# Patient Record
Sex: Male | Born: 1975 | Race: White | Hispanic: No | Marital: Married | Smoking: Never smoker
Health system: Southern US, Community
[De-identification: ages and names within clinical notes are randomized; demographics above are authoritative.]

## PROBLEM LIST (undated history)

## (undated) DIAGNOSIS — K219 Gastro-esophageal reflux disease without esophagitis: Secondary | ICD-10-CM

## (undated) DIAGNOSIS — Q2112 Patent foramen ovale: Secondary | ICD-10-CM

## (undated) DIAGNOSIS — L409 Psoriasis, unspecified: Secondary | ICD-10-CM

## (undated) DIAGNOSIS — K589 Irritable bowel syndrome without diarrhea: Secondary | ICD-10-CM

## (undated) DIAGNOSIS — Q211 Atrial septal defect: Secondary | ICD-10-CM

## (undated) DIAGNOSIS — I10 Essential (primary) hypertension: Secondary | ICD-10-CM

## (undated) DIAGNOSIS — E785 Hyperlipidemia, unspecified: Secondary | ICD-10-CM

## (undated) HISTORY — DX: Patent foramen ovale: Q21.12

## (undated) HISTORY — DX: Psoriasis, unspecified: L40.9

## (undated) HISTORY — DX: Hyperlipidemia, unspecified: E78.5

## (undated) HISTORY — DX: Essential (primary) hypertension: I10

## (undated) HISTORY — DX: Atrial septal defect: Q21.1

## (undated) HISTORY — DX: Gastro-esophageal reflux disease without esophagitis: K21.9

## (undated) HISTORY — DX: Irritable bowel syndrome, unspecified: K58.9

---

## 2000-10-03 ENCOUNTER — Encounter: Admission: RE | Admit: 2000-10-03 | Discharge: 2000-10-03 | Payer: Self-pay | Admitting: Family Medicine

## 2000-10-03 ENCOUNTER — Encounter: Payer: Self-pay | Admitting: Emergency Medicine

## 2002-09-06 ENCOUNTER — Encounter: Admission: RE | Admit: 2002-09-06 | Discharge: 2002-09-06 | Payer: Self-pay | Admitting: Emergency Medicine

## 2002-09-06 ENCOUNTER — Encounter: Payer: Self-pay | Admitting: Emergency Medicine

## 2002-12-13 ENCOUNTER — Emergency Department (HOSPITAL_COMMUNITY): Admission: EM | Admit: 2002-12-13 | Discharge: 2002-12-13 | Payer: Self-pay | Admitting: Emergency Medicine

## 2006-02-25 ENCOUNTER — Observation Stay (HOSPITAL_COMMUNITY): Admission: EM | Admit: 2006-02-25 | Discharge: 2006-02-26 | Payer: Self-pay | Admitting: Emergency Medicine

## 2006-04-10 ENCOUNTER — Encounter (INDEPENDENT_AMBULATORY_CARE_PROVIDER_SITE_OTHER): Payer: Self-pay | Admitting: *Deleted

## 2006-04-10 ENCOUNTER — Ambulatory Visit (HOSPITAL_COMMUNITY): Admission: RE | Admit: 2006-04-10 | Discharge: 2006-04-10 | Payer: Self-pay | Admitting: *Deleted

## 2007-04-16 ENCOUNTER — Emergency Department (HOSPITAL_COMMUNITY): Admission: EM | Admit: 2007-04-16 | Discharge: 2007-04-16 | Payer: Self-pay | Admitting: Emergency Medicine

## 2008-10-07 HISTORY — PX: SHOULDER SURGERY: SHX246

## 2010-09-12 ENCOUNTER — Ambulatory Visit (HOSPITAL_COMMUNITY)
Admission: RE | Admit: 2010-09-12 | Discharge: 2010-09-12 | Payer: Self-pay | Source: Home / Self Care | Attending: Orthopedic Surgery | Admitting: Orthopedic Surgery

## 2010-12-17 LAB — DIFFERENTIAL
Basophils Absolute: 0 K/uL (ref 0.0–0.1)
Basophils Relative: 1 % (ref 0–1)
Eosinophils Absolute: 0.1 K/uL (ref 0.0–0.7)
Eosinophils Relative: 2 % (ref 0–5)
Lymphocytes Relative: 35 % (ref 12–46)
Lymphs Abs: 1.5 K/uL (ref 0.7–4.0)
Monocytes Absolute: 0.4 K/uL (ref 0.1–1.0)
Monocytes Relative: 11 % (ref 3–12)
Neutro Abs: 2.1 K/uL (ref 1.7–7.7)
Neutrophils Relative %: 51 % (ref 43–77)

## 2010-12-17 LAB — CBC
Hemoglobin: 15.9 g/dL (ref 13.0–17.0)
MCV: 93.9 fL (ref 78.0–100.0)
Platelets: 219 10*3/uL (ref 150–400)
RBC: 4.78 MIL/uL (ref 4.22–5.81)
RDW: 11.7 % (ref 11.5–15.5)
WBC: 4.2 10*3/uL (ref 4.0–10.5)

## 2010-12-17 LAB — COMPREHENSIVE METABOLIC PANEL WITH GFR
ALT: 37 U/L (ref 0–53)
AST: 22 U/L (ref 0–37)
Albumin: 4.2 g/dL (ref 3.5–5.2)
Alkaline Phosphatase: 60 U/L (ref 39–117)
BUN: 9 mg/dL (ref 6–23)
CO2: 26 meq/L (ref 19–32)
Calcium: 9.3 mg/dL (ref 8.4–10.5)
Chloride: 107 meq/L (ref 96–112)
Creatinine, Ser: 0.74 mg/dL (ref 0.4–1.5)
GFR calc non Af Amer: >60 mL/min
Glucose, Bld: 83 mg/dL (ref 70–99)
Potassium: 4.7 meq/L (ref 3.5–5.1)
Sodium: 139 meq/L (ref 135–145)
Total Bilirubin: 1.1 mg/dL (ref 0.3–1.2)
Total Protein: 6.6 g/dL (ref 6.0–8.3)

## 2010-12-17 LAB — SURGICAL PCR SCREEN
MRSA, PCR: NEGATIVE
Staphylococcus aureus: NEGATIVE

## 2011-02-22 NOTE — Discharge Summary (Signed)
Alan Lawson, BOARDLEY NO.:  [id number]   MEDICAL RECORD NO.:  [id number]          PATIENT TYPE:  INP   LOCATION:  3015                         FACILITY:  [facility]   PHYSICIAN:  Genene Churn. Love, M.D.    DATE OF BIRTH:  11-Feb-1976   DATE OF ADMISSION:  02/25/2006  DATE OF DISCHARGE:  02/26/2006                                 DISCHARGE SUMMARY   This was the first [facility] admission for this 35 year old right-  handed white married male from [place], [place], admitted for  evaluation of blurred vision, right arm pain and numbness, speech  disturbance, and headache that was recurring.   HISTORY OF PRESENT ILLNESS:  Mr. Leclaire has no known history of risk factors  for stroke such as high blood pressure, diabetes, heart disease, or  cigarette use.  His sister has a history of migraine headaches, and the  patient has probably had four headaches that were migraine in his life.  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  It was  associated with numbness and then headache, mostly in the back of his head.  His wife noted that he had difficulty getting his words out.  A CT scan and  MRI study of the brain were normal at [facility].  He had a recurrent episode in the emergency room and was admitted for MRA  and observation.  His past medical history was remarkable for concussion  while in the 7th grade.  He had no real exposure to toxins. He drank 5 or 6  beers per week.  He did not use cigarettes.  His examination was normal.  He  did have a heart rate of 53.   LABORATORY DATA:  White blood cell count was 7500, hemoglobin 14.4,  hematocrit 42, platelet count 300,000.  PT was 14.4, INR was 1.1.  His  sodium is 137.  The potassium was 3.6.  His chloride was 102, bicarbonate  was 28, BUN was 11, and his creatinine was 0.8.  His calcium was 9.2.   His  liver function tests were normal.  Telemetry in the hospital showed sinus  bradycardia, and at one time his heart rate got down into the 39 range while  asleep.  His 12-lead EKG showed sinus bradycardia but otherwise was normal.  Intracranial MRA without contrast was normal.  There was some irregularity  of the P1 segment on the right.  MR angiogram of the neck was normal.  Doppler study of the carotids was normal, and vertebral flow was antegrade.  EEG was normal during the awake and sleep states.  A 2D echocardiogram which  he had been performed by Dr. Reyes Ivan as part of a pre-employment for SW[employer]  team evaluation on 11/26/2005 was normal.  A Doppler study with transcranial  evaluation was to be performed and would be set up as an outpatient.   HOSPITAL COURSE:  The patient had no recurrent episodes in the  hospital, was  maintained on aspirin, and he was felt most likely to have had complex  migraine.  His pending studies at the time of discharge include an ANA and  sed rate and an outpatient Doppler with bubble study.   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   PLAN:  Aspirin. outpatient studies as above.  He is discharged improved from  his prehospital status.           ______________________________  Genene Churn. Sandria Manly, M.D.     JML/MEDQ  D:  02/27/2006  T:  02/27/2006  Job:  [id number]   cc:   Reuben Likes, M.D.  Fax: [phone]

## 2011-02-22 NOTE — H&P (Signed)
NAMEESMERALDA, Lawson NO.:  [id number]   MEDICAL RECORD NO.:  [id number]          PATIENT TYPE:  INP   LOCATION:  1823                         FACILITY:  [facility]   PHYSICIAN:  Genene Churn. Love, M.D.    DATE OF BIRTH:  Dec 12, 1975   DATE OF ADMISSION:  02/25/2006  DATE OF DISCHARGE:                                HISTORY & PHYSICAL   This first [facility] admission for this 35 year old right-handed  white married male from [place], [place] admitted from the  emergency room for evaluation of blurred vision, headache, aphasia and right-  sided numbness.   HISTORY OF PRESENT ILLNESS:  Alan Lawson has been in good health his entire  life.  He has a positive family history of migraines and his sisters had  severe migraine headaches.  Mr. Alan Lawson has also had four severe headaches in  his life each preceded by visual disturbance lasting approximately 5 minutes  which he thought most likely represented migraine.  He was in his usual  state of health until the day of admission when 12:30 noon he noted right  eye blurry vision.  Subsequently, he had right arm pain and numbness in the  right side and posterior headache followed by nausea and vomiting; this  recurred.  His wife noticed that he was confused and he had difficulty in  getting his words out, though he knew what he wanted to say.  He had a CT  scan and MRI study of the brain at [facility] which were normal.  He then had recurrence of symptoms following the tests.  He has no history  of drug or alcohol abuse.  He had no recent history of head or neck trauma.  He does not have any history of car sickness as a child.   His past medical history is significant for concussion in seventh grade with  loss of consciousness and amnesia lasting hours.  He is on no medications.  He drinks 5/6 beers per week.  He does not have any allergies.   FAMILY HISTORY:  His mother is 16 and his father is 65 living and  well.  He  has a sister 69 living and well.   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   EXAMINATION:  Well-developed white male.  Neck was not stiff.  Heart rate  53, blood pressure right arm 10/58, left arm 120/60, temperature was  afebrile.  Mental status shows he is alert, oriented x3.  Follows one, two  and three-step commands.  Cranial nerve examination revealed right ptosis.  Visual fields full.  Disks were flat.  The extraocular movements were full  and corneals were present.  There was no seventh nerve palsy.  Tongue was  midline.  The uvula was midline and gags were present.  Sternocleidomastoid  and trapezius testing were normal.  Motor examination revealed 5/5 strength  in the upper and lower extremities.  Coordination testing revealed finger-to-  nose, heel-to-shin, rapid alternating movements to be intact.  Sensory  examination was intact.  Deep tendon reflexes were 2+.  Plantar responses  were downgoing.  General examination was unremarkable.   LABORATORY DATA:  CT scan and MRI study of the brain were normal but no  intracranial MRA was performed. White blood cell count 7500, hemoglobin  14.4, hematocrit 42.0, platelets 300,000.  PT was 11.4, INR was 1.1, PTT was  28.  Sodium is 137, potassium 3.6, chloride 102, CO2 content 28, BUN 11,  creatinine 0.8, glucose 98, calcium 9.2.  Liver function tests were normal.   IMPRESSION:  1.  Headaches, code 784.0.  2.  Blurred vision, code 368.8.  3.  Aphasia, code 784.3.  4.  Right-sided numbness, code 782.0.   Plan at this time is to admit the patient for an extracranial MRA and  extracranial MRA, sed rate and ANA.           ______________________________  Genene Churn. Sandria Manly, M.D.     JML/MEDQ  D:  02/25/2006  T:  02/25/2006  Job:  [id number]   cc:   Reuben Likes, M.D.  Fax: [phone]

## 2011-02-22 NOTE — Procedures (Signed)
EEG NUMBER:  [id number]   HISTORY:  This is a 35 year old patient who is admitted for evaluation of  visual disturbance involving the left brain. The patient is being evaluated  for this event. This is a routine EEG. No skull defects are noted.   EEG CLASSIFICATION:  Normal, awake and asleep.   DESCRIPTION OF RECORDING:  The background rhythm of this recording consists  of fairly well modulated amplitude alpha rhythm 10 Hz and is reactive to eye  opening and closure. As the record progresses, the patient initially is in  the wake state but enters a drowsy state with the dropout of the background  rhythm activities and onset of theta frequency slowing. The patient does  seem to enter early stage 2 sleep with rudimentary sleep spindles and k  complexes complexes seen. Towards the end of the recording, the patient is  re-alerted, photic stimulation is performed resulting in bilateral and  symmetric photic driving response. Hyperventilation is also performed  resulting in a minimal buildup of background rhythm activity without  significant slowing seen. At no time during the recording does there appear  to be evidence of spike or spike wave discharges or evidence of focal  slowing. EKG monitor shows no evidence of cardiac rhythm abnormalities with  a heart rate of 56.   IMPRESSION:  This is a normal EEG recording in a waking and sleeping state.  No evidence of ictal or interictal discharges were seen.      Marlan Palau, M.D.  Electronically Signed     ZOX:WRUE  D:  02/26/2006 18:40:11  T:  02/27/2006 09:14:16  Job #:  [id number]

## 2011-12-02 ENCOUNTER — Emergency Department (INDEPENDENT_AMBULATORY_CARE_PROVIDER_SITE_OTHER): Payer: 59

## 2011-12-02 ENCOUNTER — Other Ambulatory Visit: Payer: Self-pay

## 2011-12-02 ENCOUNTER — Emergency Department (INDEPENDENT_AMBULATORY_CARE_PROVIDER_SITE_OTHER)
Admission: EM | Admit: 2011-12-02 | Discharge: 2011-12-02 | Disposition: A | Discharge status: Home / Self Care | Payer: 59 | Source: Home / Self Care | Attending: Emergency Medicine | Admitting: Emergency Medicine

## 2011-12-02 ENCOUNTER — Encounter (HOSPITAL_COMMUNITY): Payer: Self-pay | Admitting: *Deleted

## 2011-12-02 DIAGNOSIS — B349 Viral infection, unspecified: Secondary | ICD-10-CM

## 2011-12-02 DIAGNOSIS — B9789 Other viral agents as the cause of diseases classified elsewhere: Secondary | ICD-10-CM

## 2011-12-02 LAB — DIFFERENTIAL
Basophils Relative: 1 % (ref 0–1)
Eosinophils Absolute: 0.1 10*3/uL (ref 0.0–0.7)
Eosinophils Relative: 2 % (ref 0–5)
Lymphs Abs: 1.3 10*3/uL (ref 0.7–4.0)
Monocytes Absolute: 0.7 10*3/uL (ref 0.1–1.0)
Monocytes Relative: 17 % — ABNORMAL HIGH (ref 3–12)
Neutrophils Relative %: 48 % (ref 43–77)

## 2011-12-02 LAB — COMPREHENSIVE METABOLIC PANEL
ALT: 29 U/L (ref 0–53)
Alkaline Phosphatase: 82 U/L (ref 39–117)
BUN: 19 mg/dL (ref 6–23)
CO2: 29 mEq/L (ref 19–32)
Chloride: 102 mEq/L (ref 96–112)
Creatinine, Ser: 0.7 mg/dL (ref 0.50–1.35)
GFR calc Af Amer: >90 mL/min (ref >90)
Potassium: 4.2 mEq/L (ref 3.5–5.1)
Total Bilirubin: 0.3 mg/dL (ref 0.3–1.2)

## 2011-12-02 LAB — CBC
HCT: 44.5 % (ref 39.0–52.0)
Hemoglobin: 15.7 g/dL (ref 13.0–17.0)
MCH: 33.3 pg (ref 26.0–34.0)
MCHC: 35.3 g/dL (ref 30.0–36.0)
RDW: 11.7 % (ref 11.5–15.5)

## 2011-12-02 LAB — LIPASE, BLOOD: Lipase: 31 U/L (ref 11–59)

## 2011-12-02 MED ORDER — MELOXICAM 15 MG PO TABS: 15.0000 mg | ORAL_TABLET | Freq: Every day | ORAL | Status: AC

## 2011-12-02 MED ORDER — SUCRALFATE 1 GM/10ML PO SUSP: 1.0000 g | Freq: Four times a day (QID) | ORAL | Status: DC

## 2011-12-02 NOTE — ED Provider Notes (Signed)
Chief Complaint  Patient presents with  . 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 6 days ago he developed some chest pain described as a soreness in the left pectoral area. This felt like a muscle soreness. The next day the soreness extended to his upper back across the shoulder blade area. Thereafter he developed a sensation of his stomach knotting up. He didn't have much of an appetite which is unusual for him and his stools were white colored. Yesterday he tried to run, and he only ran about 5 minutes then developed generalized, nausea, vomiting, and his chest was hurting again. He denies any fever, chills, sweats, nasal congestion, rhinorrhea, or sore throat. He's had no coughing, wheezing, or shortness of breath. No palpitations or dizziness. He denies any diarrhea or urinary symptoms.  Review of Systems:  Other than noted above, the patient denies any of the following symptoms. Systemic:  No fever, chills, sweats, or fatigue. ENT:  No nasal congestion, rhinorrhea, or sore throat. Pulmonary:  No cough, wheezing, shortness of breath, sputum production, hemoptysis. Cardiac:  No palpitations, rapid heartbeat, dizziness, presyncope or syncope. GI:  No abdominal pain, heartburn, nausea, or vomiting. Skin:  No rash or itching. Ext:  No leg pain or swelling.   PMFSH:  Past medical history, family history, social history, meds, and allergies were reviewed and updated as needed.  Physical Exam:   Vital signs:  BP 123/75  Pulse 50  Temp(Src) 97.3 F (36.3 C) (Oral)  Resp 16  SpO2 100% Gen:  Alert, oriented, in no distress, skin warm and dry. Eye:  PERRL, lids and conjunctivas normal.  Sclera non-icteric. ENT:  Mucous membranes moist, pharynx clear. Neck:  Supple, no adenopathy or tenderness.  No JVD. Lungs:  Clear to auscultation, no wheezes, rales or rhonchi.  No respiratory distress. Heart:  Regular rhythm.   No gallops, murmers, clicks or rubs. Chest:  No chest wall tenderness. Abdomen:  Soft, nontender, no organomegaly or mass.  Bowel sounds normal.  No pulsatile abdominal mass or bruit. Ext:  No edema.  No calf tenderness and Homann's sign negative.  Pulses full and equal. Skin:  Warm and dry.  No rash.  Labs:   Results for orders placed during the hospital encounter of 12/02/11  CBC      Component Value Range   WBC 4.1  4.0 - 10.5 (K/uL)   RBC 4.71  4.22 - 5.81 (MIL/uL)   Hemoglobin 15.7  13.0 - 17.0 (g/dL)   HCT 16.1  09.6 - 04.5 (%)   MCV 94.5  78.0 - 100.0 (fL)   MCH 33.3  26.0 - 34.0 (pg)   MCHC 35.3  30.0 - 36.0 (g/dL)   RDW 40.9  81.1 - 91.4 (%)   Platelets 216  150 - 400 (K/uL)  DIFFERENTIAL      Component Value Range   Neutrophils Relative 48  43 - 77 (%)   Neutro Abs 2.0  1.7 - 7.7 (K/uL)   Lymphocytes Relative 33  12 - 46 (%)   Lymphs Abs 1.3  0.7 - 4.0 (K/uL)   Monocytes Relative 17 (*) 3 - 12 (%)   Monocytes Absolute 0.7  0.1 - 1.0 (K/uL)   Eosinophils Relative 2  0 - 5 (%)   Eosinophils Absolute 0.1  0.0 - 0.7 (K/uL)   Basophils Relative 1  0 - 1 (%)   Basophils Absolute  0.0  0.0 - 0.1 (K/uL)  LIPASE, BLOOD      Component Value Range   Lipase 31  11 - 59 (U/L)  COMPREHENSIVE METABOLIC PANEL      Component Value Range   Sodium 140  135 - 145 (mEq/L)   Potassium 4.2  3.5 - 5.1 (mEq/L)   Chloride 102  96 - 112 (mEq/L)   CO2 29  19 - 32 (mEq/L)   Glucose, Bld 84  70 - 99 (mg/dL)   BUN 19  6 - 23 (mg/dL)   Creatinine, Ser 1.61  0.50 - 1.35 (mg/dL)   Calcium 9.6  8.4 - 09.6 (mg/dL)   Total Protein 7.6  6.0 - 8.3 (g/dL)   Albumin 4.1  3.5 - 5.2 (g/dL)   AST 23  0 - 37 (U/L)   ALT 29  0 - 53 (U/L)   Alkaline Phosphatase 82  39 - 117 (U/L)   Total Bilirubin 0.3  0.3 - 1.2 (mg/dL)   GFR calc non Af Amer >90  >90 (mL/min)   GFR calc Af Amer >90  >90 (mL/min)     Radiology:  Dg Chest 2 View  12/02/2011  *RADIOLOGY REPORT*  Clinical Data: Chest, abdominal pain   CHEST - 2 VIEW  Comparison: 09/12/2010  Findings: Lungs are clear. No pleural effusion or pneumothorax.  Cardiomediastinal silhouette is within normal limits.  Visualized osseous structures are within normal limits.  IMPRESSION: Normal chest radiographs.  Original Report Authenticated By: Charline Bills, M.D.    EKG:   Date: 12/02/2011  Rate: 42  Rhythm: sinus bradycardia  QRS Axis: normal  Intervals: normal  ST/T Wave abnormalities: normal  Conduction Disutrbances:none  Narrative Interpretation: Moderate sinus bradycardia, otherwise normal EKG.  Old EKG Reviewed: none available  Assessment:   Diagnoses that have been ruled out:  None  Diagnoses that are still under consideration:  None  Final diagnoses:  Viral syndrome    Plan:   1.  The following meds were prescribed:   New Prescriptions   MELOXICAM (MOBIC) 15 MG TABLET    Take 1 tablet (15 mg total) by mouth daily.   SUCRALFATE (CARAFATE) 1 GM/10ML SUSPENSION    Take 10 mLs (1 g total) by mouth 4 (four) times daily.   2.  The patient was instructed in symptomatic care and handouts were given. 3.  The patient was told to return if becoming worse in any way, if no better in 3 or 4 days, and given some red flag symptoms that would indicate earlier return.  Follow up:  The patient was told to follow up in 48 hours if no improvement.     Roque Lias, MD 12/02/11 [phone]

## 2011-12-02 NOTE — Discharge Instructions (Signed)
Rest and get extra fluids.  If no better in 3 days, return here or to your primary care doctor.

## 2011-12-02 NOTE — ED Notes (Signed)
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

## 2012-03-29 ENCOUNTER — Ambulatory Visit (INDEPENDENT_AMBULATORY_CARE_PROVIDER_SITE_OTHER): Payer: 59 | Admitting: Family Medicine

## 2012-03-29 VITALS — BP 130/73 | HR 65 | Temp 98.3°F | Resp 16 | Ht 75.0 in | Wt 175.0 lb

## 2012-03-29 DIAGNOSIS — H66009 Acute suppurative otitis media without spontaneous rupture of ear drum, unspecified ear: Secondary | ICD-10-CM

## 2012-03-29 DIAGNOSIS — H6690 Otitis media, unspecified, unspecified ear: Secondary | ICD-10-CM

## 2012-03-29 DIAGNOSIS — H60339 Swimmer's ear, unspecified ear: Secondary | ICD-10-CM

## 2012-03-29 MED ORDER — AMOXICILLIN 500 MG PO CAPS: 1000.0000 mg | ORAL_CAPSULE | Freq: Three times a day (TID) | ORAL | Status: AC

## 2012-03-29 MED ORDER — NEOMYCIN-POLYMYXIN-HC 3.5-10000-1 OT SOLN: 3.0000 [drp] | Freq: Four times a day (QID) | OTIC | Status: AC

## 2012-03-29 NOTE — Patient Instructions (Signed)
Otitis Media, Adult  A middle ear infection is an infection in the space behind the eardrum. The medical name for this is "otitis media." It may happen after a common cold. It is caused by a germ that starts growing in that space. You may feel swollen glands in your neck on the side of the ear infection.  HOME CARE INSTRUCTIONS    Take your medicine as directed until it is gone, even if you feel better after the first few days.   Only take over-the-counter or prescription medicines for pain, discomfort, or fever as directed by your caregiver.   Occasional use of a nasal decongestant a couple times per day may help with discomfort and help the eustachian tube to drain better.  Follow up with your caregiver in 10 to 14 days or as directed, to be certain that the infection has cleared. Not keeping the appointment could result in a chronic or permanent injury, pain, hearing loss and disability. If there is any problem keeping the appointment, you must call back to this facility for assistance.  SEEK IMMEDIATE MEDICAL CARE IF:    You are not getting better in 2 to 3 days.   You have pain that is not controlled with medication.   You feel worse instead of better.   You cannot use the medication as directed.   You develop swelling, redness or pain around the ear or stiffness in your neck.  MAKE SURE YOU:    Understand these instructions.   Will watch your condition.   Will get help right away if you are not doing well or get worse.  Document Released: 06/28/2004 Document Revised: 09/12/2011 Document Reviewed: 04/29/2008  Exit[employer] Patient Information 2012 [employer]  Otitis Externa  Otitis externa ("swimmer's ear") is a germ (bacterial) or fungal infection of the outer ear canal (from the eardrum to the outside of the ear). Swimming in dirty water may cause swimmer's ear. It also may be caused by moisture in the ear from water remaining after swimming or bathing. Often the first signs of infection may be  itching in the ear canal. This may progress to ear canal swelling, redness, and pus drainage, which may be signs of infection.  HOME CARE INSTRUCTIONS    Apply the antibiotic drops to the ear canal as prescribed by your doctor.   This can be a very painful medical condition. A strong pain reliever may be prescribed.   Only take over-the-counter or prescription medicines for pain, discomfort, or fever as directed by your caregiver.   If your caregiver has given you a follow-up appointment, it is very important to keep that appointment. Not keeping the appointment could result in a chronic or permanent injury, pain, hearing loss and disability. If there is any problem keeping the appointment, you must call back to this facility for assistance.  PREVENTION    It is important to keep your ear dry. Use the corner of a towel to wick water out of the ear canal after swimming or bathing.   Avoid scratching in your ear. This can damage the ear canal or remove the protective wax lining the canal and make it easier for germs (bacteria) or a fungus to grow.   You may use ear drops made of rubbing alcohol and vinegar after swimming to prevent future "swimmer's ear" infections. Make up a small bottle of equal parts white vinegar and alcohol. Put 3 or 4 drops into each ear after swimming.   Avoid swimming   in lakes, polluted water, or poorly chlorinated pools.  SEEK MEDICAL CARE IF:    An oral temperature above 102 F (38.9 C) develops.   Your ear is still painful after 3 days and shows signs of getting worse (redness, swelling, pain, or pus).  MAKE SURE YOU:    Understand these instructions.   Will watch your condition.   Will get help right away if you are not doing well or get worse.  Document Released: 09/23/2005 Document Revised: 09/12/2011 Document Reviewed: 04/29/2008  [employer] Patient Information 2012 [employer]

## 2012-03-29 NOTE — Progress Notes (Signed)
  Subjective:    Patient ID: Alan Lawson, male    DOB: Feb 04, 1976, 36 y.o.   MRN: [id number]  HPI EAR PAIN Location:  L ear  Description: L ear fullness and pain  Onset:  2-3 days  Modifying factors: baseline hx/o psoriasis. Well controlled. Has been swimming daily.    Symptoms  Sensation of fullness: mild Ear discharge: no URI symptoms: no  Fever: no Tinnitus:no   Dizziness:no   Hearing loss:no   Toothache: no' mild L sided jaw pain  Rashes or lesions: no Facial muscle weakness: no  Red Flags Recent trauma: no PMH prior ear surgery:  no Diabetes or Immunosuppresion: psoriasis, well controlled   Review of Systems See HPI, otherwise ROS negative     Objective:   Physical Exam Gen: up in chair, NAD HEENT: NCAT, EOMI, L TM bulging and erythema, + tenderness to otoscopic evaluation on L  CV: RRR, no murmurs auscultated PULM: CTAB, no wheezes, rales, rhoncii ABD: S/NT/+ bowel sounds  EXT: 2+ peripheral pulses    Assessment & Plan:  Concominant otitis media and otitis externa.  Will treat with amox and cortisporin.  Discussed general pool precautions as well as infectious red flags.  Handout given.     The patient and/or caregiver has been counseled thoroughly with regard to treatment plan and/or medications prescribed including dosage, schedule, interactions, rationale for use, and possible side effects and they verbalize understanding. Diagnoses and expected course of recovery discussed and will return if not improved as expected or if the condition worsens. Patient and/or caregiver verbalized understanding.

## 2015-01-03 ENCOUNTER — Ambulatory Visit (INDEPENDENT_AMBULATORY_CARE_PROVIDER_SITE_OTHER): Payer: 59 | Admitting: Urgent Care

## 2015-01-03 VITALS — BP 120/72 | HR 58 | Temp 97.9°F | Resp 16 | Ht 76.5 in | Wt 180.8 lb

## 2015-01-03 DIAGNOSIS — A084 Viral intestinal infection, unspecified: Secondary | ICD-10-CM

## 2015-01-03 DIAGNOSIS — R197 Diarrhea, unspecified: Secondary | ICD-10-CM | POA: Diagnosis not present

## 2015-01-03 DIAGNOSIS — R1013 Epigastric pain: Secondary | ICD-10-CM

## 2015-01-03 DIAGNOSIS — R112 Nausea with vomiting, unspecified: Secondary | ICD-10-CM

## 2015-01-03 LAB — POCT CBC
GRANULOCYTE PERCENT: 62.8 % (ref 37–80)
HEMATOCRIT: 47.6 % (ref 43.5–53.7)
Hemoglobin: 15.2 g/dL (ref 14.1–18.1)
LYMPH, POC: 1.6 (ref 0.6–3.4)
MCH: 30.7 pg (ref 27–31.2)
MCHC: 31.9 g/dL (ref 31.8–35.4)
MCV: 96.2 fL (ref 80–97)
MID (cbc): 0.5 (ref 0–0.9)
MPV: 7.3 fL (ref 0–99.8)
PLATELET COUNT, POC: 275 10*3/uL (ref 142–424)
POC Granulocyte: 3.5 (ref 2–6.9)
POC LYMPH PERCENT: 27.9 %L (ref 10–50)
POC MID %: 9.3 % (ref 0–12)
RBC: 4.95 M/uL (ref 4.69–6.13)
RDW, POC: 12.9 %
WBC: 5.6 10*3/uL (ref 4.6–10.2)

## 2015-01-03 NOTE — Progress Notes (Signed)
  Medical screening examination/treatment/procedure(s) were performed by non-physician practitioner and as supervising physician I was immediately available for consultation/collaboration.     

## 2015-01-03 NOTE — Patient Instructions (Signed)

## 2015-01-03 NOTE — Progress Notes (Signed)
MRN: [id number] DOB: Sep 29, 1976  Subjective:   Alan Lawson is a 39 y.o. male presenting for chief complaint of diarhea; Emesis; Chest Pain; and Fatigue  Reports 4 day history of watery malodorous diarrhea, severe projectile vomiting (Friday only) even elicited back pain, states he lost 6 lbs since Friday, has had chills, abdominal and epigastric pain. Also, reports that he has been feeling mid-sternum to mid left-sided chest pain; pain is dull, constant with intermittent sharper pains, non-radiating. He has tried ibuprofen intermittently with some relief. He admits history of PFO, states that it was 2mm, stable at his last visit with Dr. Valentino Saxon, has not needed follow up in a "long time". Of note, on Friday, patient went to [employer] in the afternoon, had 1 hot dog in the evening, started having symptoms Friday evening. Also states he ate a pack of pistachios on Thursday, then became concerned that he saw on the news a report of salmonella contamination with the same bag of pistachios that he ate. Denies fevers, cough, shob, heart racing, palpitations, diaphoresis, jaw pain, neck pain, radiation of chest pain to back. He has not spent time camping, drink water from unclean sources. Denies smoking, 5-6 beers per week. Denies any other aggravating or relieving factors, no other questions or concerns.  Markcus has a current medication list which includes the following prescription(s): calcium carbonate-vitamin d, glucosamine hcl, ibuprofen, and triamcinolone cream. He has No Known Allergies.  Dailyn  has no past medical history on file. Also  has no past surgical history on file.  ROS As in subjective.  Objective:   Vitals: BP 120/72 mmHg  Pulse 58  Temp(Src) 97.9 F (36.6 C) (Oral)  Resp 16  Ht 6' 4.5" (1.943 m)  Wt 180 lb 12.8 oz (82.01 kg)  BMI 21.72 kg/m2  SpO2 98%  Physical Exam  Constitutional: He is oriented to person, place, and time and well-developed, well-nourished,  and in no distress.  HENT:  Postnasal drainage present in cobblestone appearance.  Eyes: Conjunctivae and EOM are normal. Right eye exhibits no discharge. Left eye exhibits no discharge. No scleral icterus.  Cardiovascular: Normal rate, regular rhythm and intact distal pulses.  Exam reveals no gallop and no friction rub.   No murmur heard. No re-producible chest pain on palpation.  Pulmonary/Chest: No respiratory distress. He has no wheezes. He has no rales. He exhibits no tenderness.  Abdominal: Soft. He exhibits no distension and no mass. There is tenderness (epigastric). There is no rebound and no guarding.  Slightly hyperactive bowel sounds.  Neurological: He is alert and oriented to person, place, and time.  Skin: Skin is warm and dry. No rash noted. No erythema.   Results for orders placed or performed in visit on 01/03/15 (from the past 24 hour(s))  POCT CBC     Status: None   Collection Time: 01/03/15 12:22 PM  Result Value Ref Range   WBC 5.6 4.6 - 10.2 K/uL   Lymph, poc 1.6 0.6 - 3.4   POC LYMPH PERCENT 27.9 10 - 50 %L   MID (cbc) 0.5 0 - 0.9   POC MID % 9.3 0 - 12 %M   POC Granulocyte 3.5 2 - 6.9   Granulocyte percent 62.8 37 - 80 %G   RBC 4.95 4.69 - 6.13 M/uL   Hemoglobin 15.2 14.1 - 18.1 g/dL   HCT, POC 78.2 95.6 - 53.7 %   MCV 96.2 80 - 97 fL   MCH, POC 30.7 27 -  31.2 pg   MCHC 31.9 31.8 - 35.4 g/dL   RDW, POC 16.112.9 %   Platelet Count, POC 275 142 - 424 K/uL   MPV 7.3 0 - 99.8 fL   Assessment and Plan :   1. Non-intractable vomiting with nausea, vomiting of unspecified type 2. Watery diarrhea 3. Abdominal pain, epigastric 4. Viral gastroenteritis - Reassured patient, advised aggressive hydration, light meals including soups, reintroduce solids in 2-3 days. Patient declined ECG, GI cocktail. Anticipatory guidance provided. Recheck in 5 days if symptoms persist or sooner if they worsen.   Wallis BambergMario Kharma Sampsel, PA-C [facility] [phone] 01/03/2015 11:50 AM

## 2015-01-04 ENCOUNTER — Telehealth: Payer: Self-pay | Admitting: Radiology

## 2015-01-04 NOTE — Telephone Encounter (Signed)
Pt was seen 01/03/14 and wife Lanora Manislizabeth states he is feeling dramatically worse. She is afraid he has food poisoning b/c he ingested pistachios that were recalled. She is requesting a cb to discuss. [phone].

## 2015-01-04 NOTE — Telephone Encounter (Signed)
Wife called concerned that patient is still having watery diarrhea, had ~15 BM yesterday, this slowed down today but she is worried that he has salmonella given the news report and confirming that the patient had the exposure to the foods in question, namely pistachios. Denies bloody diarrhea, fevers, abdominal pain. Admits that the patient has a history of IBS. Has seen a GI doctor more than 10 years ago, has not needed follow up since then. I advised patient's wife that he can try Imodium to help with diarrhea. Re-emphasized aggressive hydration, BRAT diet until symptoms resolve, thereafter slowly reintroduce his normal foods. If the patient continues to have symptoms, he can return to clinic for stool culture in the next 2 days. Patient's wife agreed.  Alan BambergMario Glory Graefe, PA-C [facility] [phone] 01/04/2015  11:26 AM

## 2015-09-28 ENCOUNTER — Ambulatory Visit (INDEPENDENT_AMBULATORY_CARE_PROVIDER_SITE_OTHER): Payer: Commercial Managed Care - HMO | Admitting: Cardiovascular Disease

## 2015-09-28 ENCOUNTER — Encounter: Payer: Self-pay | Admitting: Cardiovascular Disease

## 2015-09-28 VITALS — BP 138/72 | HR 68 | Ht 77.0 in | Wt 180.0 lb

## 2015-09-28 DIAGNOSIS — E785 Hyperlipidemia, unspecified: Secondary | ICD-10-CM | POA: Diagnosis not present

## 2015-09-28 DIAGNOSIS — Q211 Atrial septal defect: Secondary | ICD-10-CM | POA: Diagnosis not present

## 2015-09-28 DIAGNOSIS — Q2112 Patent foramen ovale: Secondary | ICD-10-CM

## 2015-09-28 NOTE — Assessment & Plan Note (Signed)
Alan Lawson is a 39 year old tall thin and fit-appearing Caucasian male reasonably soft surface father Alan Lawson is also patient.Marland Kitchen. He is  self-referred because of a prior 2-D echo that showed a PFO. He is otherwise a symptomatic and runs super marathons. He denies chest pain, shortness of breath, syncope. Since his many years since his PFO is diagnosed and there is a question of a valvular abnormality as well as well as a question of Marfan's disease, we'll get a 2-D echo to further evaluate his aortic root and valve

## 2015-09-28 NOTE — Progress Notes (Addendum)
     09/28/2015 Unknown Alan Lawson   10/12/1975  [id number]  Primary Physician No primary care provider on file. Primary Cardiologist: Runell GessJonathan J. 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 He was self-referred because his father, Alan Lawson, is also patient might. He has no risk factors other than family history. He suffered a hemiplegic migraine back in 2005 and had a 2-D echo with bubble study that showed a small PFO. He is incredibly fit and runs "super marathons". He is totally asymptomatic.   Current Outpatient Prescriptions  Medication Sig Dispense Refill  . Calcium Carbonate-Vitamin D (CALCIUM + D PO) Take by mouth.    Marland Kitchen. GLUCOSAMINE HCL PO Take by mouth.    Marland Kitchen. ibuprofen (ADVIL,MOTRIN) 200 MG tablet Take 200 mg by mouth every 6 (six) hours as needed.    . triamcinolone cream (KENALOG) 0.1 % Apply topically 2 (two) times daily.     No current facility-administered medications for this visit.    No Known Allergies  Social History   Social History  . Marital Status: Married    Spouse Name: N/A  . Number of Children: N/A  . Years of Education: N/A   Occupational History  . Not on file.   Social History Main Topics  . Smoking status: Never Smoker   . Smokeless tobacco: Not on file  . Alcohol Use: Yes     Comment: occasional  . Drug Use: Not on file  . Sexual Activity: Not on file   Other Topics Concern  . Not on file   Social History Narrative     Review of Systems: General: negative for chills, fever, night sweats or weight changes.  Cardiovascular: negative for chest pain, dyspnea on exertion, edema, orthopnea, palpitations, paroxysmal nocturnal dyspnea or shortness of breath Dermatological: negative for rash Respiratory: negative for cough or wheezing Urologic: negative for hematuria Abdominal: negative for nausea, vomiting, diarrhea, bright red blood  per rectum, melena, or hematemesis Neurologic: negative for visual changes, syncope, or dizziness All other systems reviewed and are otherwise negative except as noted above.    Blood pressure 138/72, pulse 68, height 6\' 5"  (1.956 m), weight 180 lb (81.647 kg).  General appearance: alert and no distress Neck: no adenopathy, no carotid bruit, no JVD, supple, symmetrical, trachea midline and thyroid not enlarged, symmetric, no tenderness/mass/nodules Lungs: clear to auscultation bilaterally Heart: regular rate and rhythm, S1, S2 normal, no murmur, click, rub or gallop Extremities: extremities normal, atraumatic, no cyanosis or edema  EKG normal sinus rhythm at 68 without ST or T-wave changes. I personally reviewed this EKG  ASSESSMENT AND PLAN:   PFO (patent foramen ovale) Alan Lawson is a 39 year old tall thin and fit-appearing Caucasian male reasonably soft surface father Alan Lawson is also patient.Marland Kitchen. He is  self-referred because of a prior 2-D echo that showed a PFO. He is otherwise a symptomatic and runs super marathons. He denies chest pain, shortness of breath, syncope. Since his many years since his PFO is diagnosed and there is a question of a valvular abnormality as well as well as a question of Marfan's disease, we'll get a 2-D echo to further evaluate his aortic root and valve      Runell GessJonathan J. Lorah Kalina MD [facility]FACP,FACC,FAHA, [facility]FSCAI 09/28/2015 2:12 PM

## 2015-09-28 NOTE — Patient Instructions (Signed)
Medication Instructions:  Your physician recommends that you continue on your current medications as directed. Please refer to the Current Medication list given to you today.   Labwork: Your physician recommends that you return for lab work in: FASTING (lipid/liver) The lab can be found on the FIRST FLOOR of out building in [address]   Testing/Procedures: Your physician has requested that you have an echocardiogram. Echocardiography is a painless test that uses sound waves to create images of your heart. It provides your doctor with information about the size and shape of your heart and how well your heart's chambers and valves are working. This procedure takes approximately one hour. There are no restrictions for this procedure.   Follow-Up: Follow up with Dr. Allyson SabalBerry as needed, pending results of Echocardiogram.   Any Other Special Instructions Will Be Listed Below (If Applicable).     If you need a refill on your cardiac medications before your next appointment, please call your pharmacy.

## 2015-10-03 ENCOUNTER — Other Ambulatory Visit: Payer: Self-pay

## 2015-10-03 ENCOUNTER — Ambulatory Visit (HOSPITAL_COMMUNITY): Payer: Commercial Managed Care - HMO | Attending: Cardiovascular Disease

## 2015-10-03 DIAGNOSIS — Q2112 Patent foramen ovale: Secondary | ICD-10-CM

## 2015-10-03 DIAGNOSIS — Q211 Atrial septal defect: Secondary | ICD-10-CM | POA: Insufficient documentation

## 2015-10-04 LAB — HEPATIC FUNCTION PANEL
ALBUMIN: 4.4 g/dL (ref 3.6–5.1)
ALK PHOS: 63 U/L (ref 40–115)
ALT: 37 U/L (ref 9–46)
AST: 24 U/L (ref 10–40)
Bilirubin, Direct: 0.1 mg/dL (ref <=0.2)
Indirect Bilirubin: 0.7 mg/dL (ref 0.2–1.2)
TOTAL PROTEIN: 7.4 g/dL (ref 6.1–8.1)
Total Bilirubin: 0.8 mg/dL (ref 0.2–1.2)

## 2015-10-04 LAB — LIPID PANEL
CHOLESTEROL: 228 mg/dL — AB (ref 125–200)
HDL: 64 mg/dL (ref >=40)
LDL CALC: 136 mg/dL — AB (ref <130)
TRIGLYCERIDES: 142 mg/dL (ref <150)
Total CHOL/HDL Ratio: 3.6 Ratio (ref <=5.0)
VLDL: 28 mg/dL (ref <30)

## 2015-10-04 NOTE — Progress Notes (Signed)
   Patient ID: Alan Lawson, male    DOB: 02/20/1976, 39 y.o.   MRN: [id number]  HPI    Review of Systems    Physical Exam

## 2015-10-05 ENCOUNTER — Telehealth: Payer: Self-pay | Admitting: Cardiovascular Disease

## 2015-10-05 DIAGNOSIS — E785 Hyperlipidemia, unspecified: Secondary | ICD-10-CM

## 2015-10-05 NOTE — Telephone Encounter (Signed)
Returning your call. °

## 2015-10-05 NOTE — Telephone Encounter (Signed)
Pt aware of Echo and Lipid/liver results

## 2015-11-06 ENCOUNTER — Telehealth: Payer: Self-pay

## 2015-11-06 NOTE — Telephone Encounter (Addendum)
Pt states his family all have ringworm and he would like to know if the Dr would call him something in just in case he get one. Please call [phone]     [facility] [address]

## 2015-11-06 NOTE — Telephone Encounter (Signed)
Advise that an OTC antifungal would work very well should he start to have itchy rash.  Deliah Boston, MS, PA-C 12:17 PM, 11/06/2015

## 2015-11-07 ENCOUNTER — Ambulatory Visit (INDEPENDENT_AMBULATORY_CARE_PROVIDER_SITE_OTHER): Payer: Commercial Managed Care - HMO | Admitting: Family Medicine

## 2015-11-07 VITALS — BP 124/78 | HR 56 | Temp 98.4°F | Resp 17 | Ht 76.5 in | Wt 195.0 lb

## 2015-11-07 DIAGNOSIS — Z8619 Personal history of other infectious and parasitic diseases: Secondary | ICD-10-CM | POA: Diagnosis not present

## 2015-11-07 DIAGNOSIS — B80 Enterobiasis: Secondary | ICD-10-CM

## 2015-11-07 MED ORDER — IVERMECTIN 3 MG PO TABS: ORAL_TABLET | ORAL | Status: DC

## 2015-11-07 NOTE — Progress Notes (Signed)
Patient ID: Alan Lawson, male    DOB: 1976/07/24  Age: 40 y.o. MRN: [id number]  Chief Complaint  Patient presents with  . patient states he has a pin worm    Subjective:   40 year old man who has a history of his family having pinworms. His daughter probably got it in daycare, and has currently. His wife another child also been treated. He has some itching of his anus, and is in here for treatment.  Current allergies, medications, problem list, past/family and social histories reviewed.  Objective:  BP 124/78 mmHg  Pulse 56  Temp(Src) 98.4 F (36.9 C) (Oral)  Resp 17  Ht 6' 4.5" (1.943 m)  Wt 195 lb (88.451 kg)  BMI 23.43 kg/m2  SpO2 97%  Exam not done.  Assessment & Plan:   Assessment: 1. Pinworm infection   2. History of intestinal parasite       Plan: Pinworms in the family. Will treat. Several treatment options are available. He has insurance.  Meds ordered this encounter  Medications  . ivermectin (STROMECTOL) 3 MG TABS tablet    Sig: Take 6 tablets single dose of mouth. Repeat in 2 weeks.    Dispense:  12 tablet    Refill:  0         Patient Instructions  Take ivermectin 6 tablets (18 mg) single dose today. Repeat in 2 weeks.       Return if symptoms worsen or fail to improve.   Angeles Zehner, MD 11/07/2015

## 2015-11-07 NOTE — Patient Instructions (Signed)
Take ivermectin 6 tablets (18 mg) single dose today. Repeat in 2 weeks.

## 2015-11-08 ENCOUNTER — Encounter: Payer: Self-pay | Admitting: Cardiovascular Disease

## 2015-11-08 ENCOUNTER — Ambulatory Visit (INDEPENDENT_AMBULATORY_CARE_PROVIDER_SITE_OTHER): Payer: Commercial Managed Care - HMO | Admitting: Cardiovascular Disease

## 2015-11-08 VITALS — BP 118/80 | HR 64 | Ht 76.5 in | Wt 203.6 lb

## 2015-11-08 DIAGNOSIS — Q211 Atrial septal defect: Secondary | ICD-10-CM | POA: Diagnosis not present

## 2015-11-08 DIAGNOSIS — E785 Hyperlipidemia, unspecified: Secondary | ICD-10-CM | POA: Insufficient documentation

## 2015-11-08 DIAGNOSIS — Q2112 Patent foramen ovale: Secondary | ICD-10-CM

## 2015-11-08 NOTE — Progress Notes (Signed)
Alan Lawson returns today for follow-up of his 2-D echo and lab work. There is no evidence of PFO on transthoracic echo although agitated saline was not administered. LV function was normal. The descending aorta showed no evidence of dilatation. He was concerned about Marfan's syndrome. His lipid profile revealed total cholesterol 228 with LDL 136 and HDL 64. This was performed 2 days after Christmas during holiday meals. Ordinarily, his diet is fairly healthy. Will recheck a lipid and liver profile in March and I will see him back as needed.

## 2015-11-08 NOTE — Patient Instructions (Signed)
Medication Instructions:  Your physician recommends that you continue on your current medications as directed. Please refer to the Current Medication list given to you today.   Labwork: REMINDER: March to lab to get you Lipids/liver checked  Testing/Procedures: none  Follow-Up: Follow up with Dr. Allyson Sabal as needed.   Any Other Special Instructions Will Be Listed Below (If Applicable).     If you need a refill on your cardiac medications before your next appointment, please call your pharmacy.

## 2015-11-08 NOTE — Assessment & Plan Note (Signed)
History of PFO demonstrated by transesophageal echo remotely in the setting of a migraine. Recent 2-D echo performed on 10/03/15 did not show intra-atrial shunt. His EF was 50-55%. His descending aorta showed no dilatation. He was worried about Marfan's disease.

## 2015-11-08 NOTE — Assessment & Plan Note (Signed)
Recent lab work showed total cholesterol 220 LDL 136 and HDL of 64. This was done after Christmas during the time of dietary indiscretion. Would ordinarily, his diet is fairly "clean". We will recheck a lipid liver profile in March.

## 2015-11-26 ENCOUNTER — Ambulatory Visit: Payer: Worker's Compensation

## 2015-11-26 ENCOUNTER — Ambulatory Visit (INDEPENDENT_AMBULATORY_CARE_PROVIDER_SITE_OTHER): Payer: Worker's Compensation | Admitting: Family Medicine

## 2015-11-26 VITALS — BP 108/64 | HR 70 | Temp 97.8°F | Resp 12 | Ht 77.0 in | Wt 190.6 lb

## 2015-11-26 DIAGNOSIS — R0789 Other chest pain: Secondary | ICD-10-CM | POA: Diagnosis not present

## 2015-11-26 DIAGNOSIS — S20212A Contusion of left front wall of thorax, initial encounter: Secondary | ICD-10-CM

## 2015-11-26 MED ORDER — HYDROCODONE-ACETAMINOPHEN 5-325 MG PO TABS: 1.0000 | ORAL_TABLET | Freq: Four times a day (QID) | ORAL | Status: DC | PRN

## 2015-11-26 NOTE — Progress Notes (Signed)
Subjective:    Patient ID: Alan Lawson, male    DOB: [birth year], 40 y.o.   MRN: [id number]  HPI Alan Lawson is a 40 y.o. male Here for an injury that occurred at work. 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  Date of injury: Thursday 11/22/16.   During patrol training techniques, engaged with other officer, got off balance, other officer landed on patient and felt pop on upper left back/side. Now feels pain radiate around past 3 days. Initially on left side of back only. No rash or bruising. No dyspnea.   Tx: Ibuprofen, up to  every 6 hours. No other treatment. Ibuprofen helps some but still feels breakthrough.  Hx of broken ribs x 3 in past - location unknown.   No Known Allergies  Prior to Admission medications   Medication Sig Start Date End Date Taking? Authorizing Provider  Calcium Carbonate-Vitamin D (CALCIUM + D PO) Take by mouth.   Yes Historical Provider, MD  GLUCOSAMINE HCL PO Take by mouth.   Yes Historical Provider, MD  ibuprofen (ADVIL,MOTRIN) 200 MG tablet Take 200 mg by mouth every 6 (six) hours as needed for moderate pain.    Yes Historical Provider, MD  triamcinolone cream (KENALOG) 0.1 % Apply topically 2 (two) times daily.   Yes Historical Provider, MD  ivermectin (STROMECTOL) 3 MG TABS tablet Take 6 tablets single dose of mouth. Repeat in 2 weeks. Patient not taking: Reported on 11/26/2015 11/07/15   Peyton Najjar, MD    Review of Systems  Constitutional: Negative for fever and chills.  Respiratory: Negative for shortness of breath.        Chest wall pain  Musculoskeletal: Positive for myalgias and arthralgias.  Skin: Negative for rash.       Objective:   Physical Exam  Constitutional: He is oriented to person, place, and time. He appears well-developed and well-nourished.  HENT:  Head: Normocephalic and atraumatic.  Eyes: EOM are normal. Pupils are equal, round, and reactive to light.  Neck:  No JVD present. Carotid bruit is not present.  Cardiovascular: Normal rate, regular rhythm and normal heart sounds.   No murmur heard. Pulmonary/Chest: Effort normal and breath sounds normal. No accessory muscle usage. No respiratory distress. He has no decreased breath sounds. He has no rales.      Musculoskeletal: He exhibits no edema.  Neurological: He is alert and oriented to person, place, and time.  Skin: Skin is warm and dry.  Psychiatric: He has a normal mood and affect.  Vitals reviewed.  Filed Vitals:   11/26/15 1350  BP: 108/64  Pulse: 70  Temp: 97.8 F (36.6 C)  TempSrc: Oral  Resp: 12  Height:  (1.956 m)  Weight: 190 lb 9.6 oz (86.456 kg)  SpO2: 97%   Dg Ribs Unilateral W/chest Left  11/26/2015  CLINICAL DATA:  Posterior left chest pain following on the job injury 3 days ago. History of rib fractures. EXAM: LEFT RIBS AND CHEST - 3+ VIEW COMPARISON:  None. FINDINGS: Heart size and mediastinal contours are normal. The lungs are clear. There is no pleural effusion or pneumothorax. There is slight irregularity of the left seventh rib laterally on the oblique view, possibly related to old injury. No displaced acute fractures are seen. IMPRESSION: Slight irregularity of the left seventh rib. No evidence of acute displaced rib fracture, pleural effusion or pneumothorax. Electronically Signed   By: Alan Noa  Purcell Mouton M.D.   On: 11/26/2015 16:35       Assessment & Plan:   Alan Lawson is a 40 y.o. male Left-sided chest wall pain - Plan: DG Ribs Unilateral W/Chest Left, HYDROcodone-acetaminophen (NORCO/VICODIN) 5-325 MG tablet  Rib contusion, left, initial encounter - Plan: HYDROcodone-acetaminophen (NORCO/VICODIN) 5-325 MG tablet Injury to left chest wall due to injury at work 4 days ago. No apparent rib fracture on x-ray. Suspect a contusion with secondary intercostal muscle strain/discomfort.  -Continue symptomatic care with ibuprofen 600 mg every 6 hours when necessary  with food.  -Incentive spirometry discussed with frequent deep breaths to lessen chance of atelectasis.  -Hydrocodone prescription given if breakthrough pain, side effects discussed.  -Work restrictions for next 5 days, then reassess. Sooner if worse.  Meds ordered this encounter  Medications  . HYDROcodone-acetaminophen (NORCO/VICODIN) 5-325 MG tablet    Sig: Take 1 tablet by mouth every 6 (six) hours as needed for moderate pain.    Dispense:  15 tablet    Refill:  0   Patient Instructions   I did not see a displaced rib fracture on your x-rays today. The radiologist will also read this shortly. See information below on rib contusion. Okay to continue ibuprofen 600 mg every 6 hours with food, hydrocodone if needed for breakthrough pain, but do not operate machinery while taking hydrocodone. Follow-up in the next 5 days for recheck. In the meantime avoid overhead lifting or heavy lifting with the left side of the body. See paperwork for your employer.  Rib Contusion A rib contusion is a deep bruise on your rib area. Contusions are the result of a blunt trauma that causes bleeding and injury to the tissues under the skin. A rib contusion may involve bruising of the ribs and of the skin and muscles in the area. The skin overlying the contusion may turn blue, purple, or yellow. Minor injuries will give you a painless contusion, but more severe contusions may stay painful and swollen for a few weeks. CAUSES  A contusion is usually caused by a blow, trauma, or direct force to an area of the body. This often occurs while playing contact sports. SYMPTOMS  Swelling and redness of the injured area.  Discoloration of the injured area.  Tenderness and soreness of the injured area.  Pain with or without movement. DIAGNOSIS  The diagnosis can be made by taking a medical history and performing a physical exam. An X-ray, CT scan, or MRI may be needed to determine if there were any associated injuries,  such as broken bones (fractures) or internal injuries. TREATMENT  Often, the best treatment for a rib contusion is rest. Icing or applying cold compresses to the injured area may help reduce swelling and inflammation. Deep breathing exercises may be recommended to reduce the risk of partial lung collapse and pneumonia. Over-the-counter or prescription medicines may also be recommended for pain control. HOME CARE INSTRUCTIONS   Apply ice to the injured area:  Put ice in a plastic bag.  Place a towel between your skin and the bag.  Leave the ice on for 20 minutes, 2-3 times per day.  Take medicines only as directed by your health care provider.  Rest the injured area. Avoid strenuous activity and any activities or movements that cause pain. Be careful during activities and avoid bumping the injured area.  Perform deep-breathing exercises as directed by your health care provider.  Do not lift anything that is heavier than 5 lb (2.3 kg)  until your health care provider approves.  Do not use any tobacco products, including cigarettes, chewing tobacco, or electronic cigarettes. If you need help quitting, ask your health care provider. SEEK MEDICAL CARE IF:   You have increased bruising or swelling.  You have pain that is not controlled with treatment.  You have a fever. SEEK IMMEDIATE MEDICAL CARE IF:   You have difficulty breathing or shortness of breath.  You develop a continual cough, or you cough up thick or bloody sputum.  You feel sick to your stomach (nauseous), you throw up (vomit), or you have abdominal pain.   This information is not intended to replace advice given to you by your health care provider. Make sure you discuss any questions you have with your health care provider.   Document Released: 06/18/2001 Document Revised: 10/14/2014 Document Reviewed: 07/05/2014 Elsevier Interactive Patient Education [employer]  Because you received an x-ray today, you will  receive an invoice from [facility]. Please contact [facility] at [phone] with questions or concerns regarding your invoice. Our billing staff will not be able to assist you with those questions.

## 2015-11-26 NOTE — Patient Instructions (Addendum)
I did not see a displaced rib fracture on your x-rays today. The radiologist will also read this shortly. See information below on rib contusion. Okay to continue ibuprofen 600 mg every 6 hours with food, hydrocodone if needed for breakthrough pain, but do not operate machinery while taking hydrocodone. Follow-up in the next 5 days for recheck. In the meantime avoid overhead lifting or heavy lifting with the left side of the body. See paperwork for your employer.  Rib Contusion A rib contusion is a deep bruise on your rib area. Contusions are the result of a blunt trauma that causes bleeding and injury to the tissues under the skin. A rib contusion may involve bruising of the ribs and of the skin and muscles in the area. The skin overlying the contusion may turn blue, purple, or yellow. Minor injuries will give you a painless contusion, but more severe contusions may stay painful and swollen for a few weeks. CAUSES  A contusion is usually caused by a blow, trauma, or direct force to an area of the body. This often occurs while playing contact sports. SYMPTOMS  Swelling and redness of the injured area.  Discoloration of the injured area.  Tenderness and soreness of the injured area.  Pain with or without movement. DIAGNOSIS  The diagnosis can be made by taking a medical history and performing a physical exam. An X-ray, CT scan, or MRI may be needed to determine if there were any associated injuries, such as broken bones (fractures) or internal injuries. TREATMENT  Often, the best treatment for a rib contusion is rest. Icing or applying cold compresses to the injured area may help reduce swelling and inflammation. Deep breathing exercises may be recommended to reduce the risk of partial lung collapse and pneumonia. Over-the-counter or prescription medicines may also be recommended for pain control. HOME CARE INSTRUCTIONS   Apply ice to the injured area:  Put ice in a plastic bag.  Place a  towel between your skin and the bag.  Leave the ice on for 20 minutes, 2-3 times per day.  Take medicines only as directed by your health care provider.  Rest the injured area. Avoid strenuous activity and any activities or movements that cause pain. Be careful during activities and avoid bumping the injured area.  Perform deep-breathing exercises as directed by your health care provider.  Do not lift anything that is heavier than 5 lb (2.3 kg) until your health care provider approves.  Do not use any tobacco products, including cigarettes, chewing tobacco, or electronic cigarettes. If you need help quitting, ask your health care provider. SEEK MEDICAL CARE IF:   You have increased bruising or swelling.  You have pain that is not controlled with treatment.  You have a fever. SEEK IMMEDIATE MEDICAL CARE IF:   You have difficulty breathing or shortness of breath.  You develop a continual cough, or you cough up thick or bloody sputum.  You feel sick to your stomach (nauseous), you throw up (vomit), or you have abdominal pain.   This information is not intended to replace advice given to you by your health care provider. Make sure you discuss any questions you have with your health care provider.   Document Released: 06/18/2001 Document Revised: 10/14/2014 Document Reviewed: 07/05/2014 Elsevier Interactive Patient Education [employer]  Because you received an x-ray today, you will receive an invoice from [facility]. Please contact [facility] at [phone] with questions or concerns regarding your invoice. Our billing staff will  not be able to assist you with those questions.

## 2017-07-18 ENCOUNTER — Encounter: Payer: Self-pay | Admitting: Physician Assistant

## 2017-07-18 ENCOUNTER — Ambulatory Visit (INDEPENDENT_AMBULATORY_CARE_PROVIDER_SITE_OTHER): Payer: 59 | Admitting: Physician Assistant

## 2017-07-18 ENCOUNTER — Ambulatory Visit (INDEPENDENT_AMBULATORY_CARE_PROVIDER_SITE_OTHER): Payer: 59

## 2017-07-18 VITALS — BP 126/82 | HR 70 | Temp 97.7°F | Resp 16 | Ht 77.0 in | Wt 175.8 lb

## 2017-07-18 DIAGNOSIS — M25522 Pain in left elbow: Secondary | ICD-10-CM

## 2017-07-18 DIAGNOSIS — L03114 Cellulitis of left upper limb: Secondary | ICD-10-CM | POA: Diagnosis not present

## 2017-07-18 DIAGNOSIS — M7989 Other specified soft tissue disorders: Secondary | ICD-10-CM | POA: Diagnosis not present

## 2017-07-18 DIAGNOSIS — M25422 Effusion, left elbow: Secondary | ICD-10-CM

## 2017-07-18 LAB — POCT CBC
Granulocyte percent: 85 %G — AB (ref 37–80)
HCT, POC: 44.8 % (ref 43.5–53.7)
HEMOGLOBIN: 15.1 g/dL (ref 14.1–18.1)
Lymph, poc: 1.1 (ref 0.6–3.4)
MCH, POC: 33.1 pg — AB (ref 27–31.2)
MCHC: 33.6 g/dL (ref 31.8–35.4)
MCV: 98.4 fL — AB (ref 80–97)
MID (cbc): 0.6 (ref 0–0.9)
MPV: 7.9 fL (ref 0–99.8)
PLATELET COUNT, POC: 200 10*3/uL (ref 142–424)
POC GRANULOCYTE: 10 — AB (ref 2–6.9)
POC LYMPH PERCENT: 9.5 %L — AB (ref 10–50)
POC MID %: 5.5 %M (ref 0–12)
RBC: 4.55 M/uL — AB (ref 4.69–6.13)
RDW, POC: 12.9 %
WBC: 11.8 10*3/uL — AB (ref 4.6–10.2)

## 2017-07-18 MED ORDER — DOXYCYCLINE HYCLATE 100 MG PO CAPS: 100.0000 mg | ORAL_CAPSULE | Freq: Two times a day (BID) | ORAL | 0 refills | Status: DC

## 2017-07-18 MED ORDER — CEFTRIAXONE SODIUM 1 G IJ SOLR
1.0000 g | Freq: Once | INTRAMUSCULAR | Status: AC
  Administered 2017-07-18: 1 g via INTRAMUSCULAR

## 2017-07-18 NOTE — Progress Notes (Signed)
BROOKE PAYES  MRN: [id number] DOB: 1976-10-03  Subjective:  Alan Lawson is a 41 y.o. male seen in office today for a chief complaint of left elbow pain x 1 day. Has associated erythema and swelling. Has tried ice with no full relief. Denies acute injury, purulent drainage, loss of ROM, numbness, and tingling. He notes he did scrape this elbow against a wall while at work a week ago. Denies hx of MRSA, diabetes, or gout. Denies smoking.   Review of Systems  Constitutional: Negative for chills, diaphoresis and fever.  Gastrointestinal: Negative for nausea and vomiting.  Neurological: Negative for dizziness and light-headedness.    Patient Active Problem List   Diagnosis Date Noted  . Hyperlipidemia 11/08/2015  . PFO (patent foramen ovale) 09/28/2015    Current Outpatient Prescriptions on File Prior to Visit  Medication Sig Dispense Refill  . ibuprofen (ADVIL,MOTRIN) 200 MG tablet Take 200 mg by mouth every 6 (six) hours as needed for moderate pain.     . Calcium Carbonate-Vitamin D (CALCIUM + D PO) Take by mouth.    Marland Kitchen GLUCOSAMINE HCL PO Take by mouth.    Marland Kitchen HYDROcodone-acetaminophen (NORCO/VICODIN) 5-325 MG tablet Take 1 tablet by mouth every 6 (six) hours as needed for moderate pain. (Patient not taking: Reported on 07/18/2017) 15 tablet 0  . triamcinolone cream (KENALOG) 0.1 % Apply topically 2 (two) times daily.     No current facility-administered medications on file prior to visit.     No Known Allergies    Social History   Social History  . Marital status: Married    Spouse name: N/A  . Number of children: N/A  . Years of education: N/A   Occupational History  . Not on file.   Social History Main Topics  . Smoking status: Never Smoker  . Smokeless tobacco: Never Used  . Alcohol use 0.0 oz/week     Comment: occasional  . Drug use: No  . Sexual activity: Not on file   Other Topics Concern  . Not on file   Social History Narrative  . No narrative on file     Objective:  BP 126/82   Pulse 70   Temp 97.7 F (36.5 C) (Oral)   Resp 16   Ht  (1.956 m)   Wt 175 lb 12.8 oz (79.7 kg)   SpO2 98%   BMI 20.85 kg/m   Physical Exam  Constitutional: He is oriented to person, place, and time and well-developed, well-nourished, and in no distress.  HENT:  Head: Normocephalic and atraumatic.  Eyes: Conjunctivae are normal.  Neck: Normal range of motion.  Cardiovascular:  Pulses:      Radial pulses are 2+ on the right side, and 2+ on the left side.  Pulmonary/Chest: Effort normal.  Musculoskeletal:     Neurological: He is alert and oriented to person, place, and time. Gait normal.  ROM and sensation intact of left arm. Strength is 5/5 in left arm.   Skin: Skin is warm and dry.     Psychiatric: Affect normal.  Vitals reviewed.    Results for orders placed or performed in visit on 07/18/17 (from the past 24 hour(s))  POCT CBC     Status: Abnormal   Collection Time: 07/18/17  2:34 PM  Result Value Ref Range   WBC 11.8 (A) 4.6 - 10.2 K/uL   Lymph, poc 1.1 0.6 - 3.4   POC LYMPH PERCENT 9.5 (A) 10 - 50 %L  MID (cbc) 0.6 0 - 0.9   POC MID % 5.5 0 - 12 %M   POC Granulocyte 10.0 (A) 2 - 6.9   Granulocyte percent 85.0 (A) 37 - 80 %G   RBC 4.55 (A) 4.69 - 6.13 M/uL   Hemoglobin 15.1 14.1 - 18.1 g/dL   HCT, POC 16.1 09.6 - 53.7 %   MCV 98.4 (A) 80 - 97 fL   MCH, POC 33.1 (A) 27 - 31.2 pg   MCHC 33.6 31.8 - 35.4 g/dL   RDW, POC 04.5 %   Platelet Count, POC 200 142 - 424 K/uL   MPV 7.9 0 - 99.8 fL   Dg Elbow Complete Left (3+view)  Result Date: 07/18/2017 CLINICAL DATA:  Swelling, redness and warmth for 1 day. EXAM: LEFT ELBOW - COMPLETE 3+ VIEW COMPARISON:  04/16/2007 FINDINGS: The joint spaces are maintained. No acute bony findings or osteochondral abnormality. There is a tiny olecranon spur. No joint effusion. Soft tissue swelling noted over the olecranon region. Findings could suggest olecranon bursitis. IMPRESSION: No acute  bony findings or joint effusion. Swelling over the olecranon region could suggest olecranon bursitis. Electronically Signed   By: Rudie Meyer M.D.   On: 07/18/2017 14:43     Assessment and Plan :  1. Pain and swelling of left elbow - POCT CBC - DG ELBOW COMPLETE LEFT (3+VIEW); Future 2. Cellulitis of left upper extremity PE findings consistent with cellulitis. Plain films suggest possible olecranon bursitis. I cannot appreciate any fluctuance on exam. WBC elevated at 11.8. Will cover with both IM ceftriaxone and oral doxycycline at this time. I have outline the erythema with a surgical marker and encouraged pt to return in 3 days to ensure appropriate healing. Given strict return/ED precautions if symptoms worsen or he develops new fever, chills, loss of ROM, nausea, and vomiting.  - cefTRIAXone (ROCEPHIN) injection 1 g; Inject 1 g into the muscle once. - doxycycline (VIBRAMYCIN) 100 MG capsule; Take 1 capsule (100 mg total) by mouth 2 (two) times daily.  Dispense: 20 capsule; Refill: 0  Benjiman Core PA-C  [facility] at [facility] 07/18/2017 2:46 PM

## 2017-07-18 NOTE — Addendum Note (Signed)
Addended by: Benjiman Core D on: 07/18/2017 03:36 PM   Modules accepted: Orders

## 2017-07-18 NOTE — Patient Instructions (Addendum)
Physical exam findings are consistent with cellulitis and potentially underlying bursitis. We are going to treat with antibiotics. You received a shot of rocephin today and will be placed on oral doxycycline for 10 days. Please return on Monday for reevaluation to ensure appropriately healing. In the meantime, if you develop worsening pain, swelling, warmth, redness, or new fever, chills, nausea, or vomiting, please seek care immediately. Thank you for letting me participate in your health and well being.  While taking Doxycycline:  -Do not drink milk or take iron supplements, multivitamins, calcium supplements, antacids, laxatives within 2 hours before or after taking doxycycline. -Avoid direct exposure to sunlight or tanning beds. Doxycycline can make you sunburn more easily. Wear protective clothing and use sunscreen (SPF 30 or higher) when you are outdoors. -Antibiotic medicines can cause diarrhea, which may be a sign of a new infection. If you have diarrhea that is watery or bloody, stop taking this medicine and seek medical care.     Cellulitis, Adult Cellulitis is a skin infection. The infected area is usually red and sore. This condition occurs most often in the arms and lower legs. It is very important to get treated for this condition. Follow these instructions at home:  Take over-the-counter and prescription medicines only as told by your doctor.  If you were prescribed an antibiotic medicine, take it as told by your doctor. Do not stop taking the antibiotic even if you start to feel better.  Drink enough fluid to keep your pee (urine) clear or pale yellow.  Do not touch or rub the infected area.  Raise (elevate) the infected area above the level of your heart while you are sitting or lying down.  Place warm or cold wet cloths (warm or cold compresses) on the infected area. Do this as told by your doctor.  Keep all follow-up visits as told by your doctor. This is important. These  visits let your doctor make sure your infection is not getting worse. Contact a doctor if:  You have a fever.  Your symptoms do not get better after 1-2 days of treatment.  Your bone or joint under the infected area starts to hurt after the skin has healed.  Your infection comes back. This can happen in the same area or another area.  You have a swollen bump in the infected area.  You have new symptoms.  You feel ill and also have muscle aches and pains. Get help right away if:  Your symptoms get worse.  You feel very sleepy.  You throw up (vomit) or have watery poop (diarrhea) for a long time.  There are red streaks coming from the infected area.  Your red area gets larger.  Your red area turns darker. This information is not intended to replace advice given to you by your health care provider. Make sure you discuss any questions you have with your health care provider. Document Released: 03/11/2008 Document Revised: 02/29/2016 Document Reviewed: 08/02/2015 Elsevier Interactive Patient Education  2018 [employer] Inc[employer]    Elbow Bursitis A bursa is a fluid-filled sac that covers and protects a joint. Bursitis is when the fluid-filled sac gets puffy and sore (inflamed). Elbow bursitis, also called olecranon bursitis, happens over your elbow. This may be caused by:  Injury (acute trauma) to your elbow.  Leaning on hard surfaces for long periods of time.  Infection from an injury that breaks the skin near your elbow.  A bone growth (spur) that forms at the tip of your  elbow.  A medical condition that causes inflammation in your body, such as: ? Gout. ? Rheumatoid arthritis.  Sometimes the cause is not known. Follow these instructions at home:  Take medicines only as told by your doctor.  If you were prescribed an antibiotic medicine, finish all of it even if you start to feel better.  If your bursitis is caused by an injury, rest your elbow and wear your bandage as  told by your doctor. You may also apply ice to the injured area as told by your doctor: ? Put ice in a plastic bag. ? Place a towel between your skin and the bag. ? Leave the ice on for 20 minutes, 2-3 times per day.  Do not do any activities that cause pain to your elbow.  Use elbow pads or wraps to cushion your elbow. Contact a doctor if:  You have a fever.  Your symptoms do not get better with treatment.  Your pain or swelling gets worse.  Your pain or swelling goes away and then comes back.  You have drainage of pus from the swollen area over your elbow. This information is not intended to replace advice given to you by your health care provider. Make sure you discuss any questions you have with your health care provider. Document Released: 03/13/2010 Document Revised: 02/29/2016 Document Reviewed: 06/01/2014 Elsevier Interactive Patient Education  2018 [employer].   IF you received an x-ray today, you will receive an invoice from [facility]. Please contact [facility] at [phone] with questions or concerns regarding your invoice.   IF you received labwork today, you will receive an invoice from [place]. Please contact [facility] at [phone] with questions or concerns regarding your invoice.   Our billing staff will not be able to assist you with questions regarding bills from these companies.  You will be contacted with the lab results as soon as they are available. The fastest way to get your results is to activate your My Chart account. Instructions are located on the last page of this paperwork. If you have not heard from [place] regarding the results in 2 weeks, please contact this office.

## 2017-07-21 ENCOUNTER — Ambulatory Visit (INDEPENDENT_AMBULATORY_CARE_PROVIDER_SITE_OTHER): Payer: 59 | Admitting: Physician Assistant

## 2017-07-21 ENCOUNTER — Ambulatory Visit: Payer: Self-pay | Admitting: Physician Assistant

## 2017-07-21 ENCOUNTER — Encounter: Payer: Self-pay | Admitting: Physician Assistant

## 2017-07-21 VITALS — BP 120/72 | HR 58 | Temp 98.3°F | Resp 16 | Ht 74.0 in | Wt 180.0 lb

## 2017-07-21 DIAGNOSIS — L03114 Cellulitis of left upper limb: Secondary | ICD-10-CM

## 2017-07-21 LAB — POCT CBC
Granulocyte percent: 67.7 % (ref 37–80)
HCT, POC: 41.9 % — AB (ref 43.5–53.7)
Hemoglobin: 14.3 g/dL (ref 14.1–18.1)
Lymph, poc: 1.8 (ref 0.6–3.4)
MCH, POC: 33.4 pg — AB (ref 27–31.2)
MCHC: 34.2 g/dL (ref 31.8–35.4)
MCV: 97.5 fL — AB (ref 80–97)
MID (cbc): 0.6 (ref 0–0.9)
MPV: 7.4 fL (ref 0–99.8)
POC Granulocyte: 4.9 (ref 2–6.9)
POC LYMPH PERCENT: 24.6 %L (ref 10–50)
POC MID %: 7.7 % (ref 0–12)
Platelet Count, POC: 253 10*3/uL (ref 142–424)
RBC: 4.29 M/uL — AB (ref 4.69–6.13)
RDW, POC: 12.7 %
WBC: 7.3 10*3/uL (ref 4.6–10.2)

## 2017-07-21 NOTE — Patient Instructions (Signed)
     IF you received an x-ray today, you will receive an invoice from [facility]. Please contact [facility] at [phone] with questions or concerns regarding your invoice.   IF you received labwork today, you will receive an invoice from [facility]. Please contact [facility] at [phone] with questions or concerns regarding your invoice.   Our billing staff will not be able to assist you with questions regarding bills from these companies.  You will be contacted with the lab results as soon as they are available. The fastest way to get your results is to activate your My Chart account. Instructions are located on the last page of this paperwork. If you have not heard from us regarding the results in 2 weeks, please contact this office.     

## 2017-07-21 NOTE — Progress Notes (Signed)
Alan Lawson  MRN: [id number] DOB: [birth year]  PCP: Patient, No Pcp Per  Subjective:  Pt is a 41 year old male presents to clinic for left elbow pain.   He was here for this problem on 07/18/17 and saw my colleague [place].  Plan from that OV: "Plain films suggest possible olecranon bursitis. I cannot appreciate any fluctuance on exam. WBC elevated at 11.8. Will cover with both IM ceftriaxone and oral doxycycline at this time. I have outline the erythema with a surgical marker and encouraged pt to return in 3 days to ensure appropriate healing." Today he states he is about 20% better with 6/10 pain. Swelling has gone down, but still c/o soreness. He is taking medication as prescribed. Denies side effects.  Denies fever, chills, increased swelling, warmth. No drainage.   Review of Systems  Constitutional: Negative for chills, diaphoresis, fatigue and fever.  Cardiovascular: Negative for chest pain and palpitations.  Musculoskeletal: Positive for arthralgias (left elbow) and joint swelling.  Skin: Positive for wound.  Neurological: Negative for weakness, numbness and headaches.    Patient Active Problem List   Diagnosis Date Noted  . Hyperlipidemia 11/08/2015  . PFO (patent foramen ovale) 09/28/2015    Current Outpatient Prescriptions on File Prior to Visit  Medication Sig Dispense Refill  . doxycycline (VIBRAMYCIN) 100 MG capsule Take 1 capsule (100 mg total) by mouth 2 (two) times daily. 20 capsule 0  . GLUCOSAMINE HCL PO Take by mouth.    Marland Kitchen ibuprofen (ADVIL,MOTRIN) 200 MG tablet Take 200 mg by mouth every 6 (six) hours as needed for moderate pain.     Marland Kitchen triamcinolone cream (KENALOG) 0.1 % Apply topically 2 (two) times daily.    . Calcium Carbonate-Vitamin D (CALCIUM + D PO) Take by mouth.     No current facility-administered medications on file prior to visit.     No Known Allergies   Objective:  BP 120/72   Pulse (!) 58   Temp 98.3 F (36.8 C) (Oral)   Resp 16    Ht  (1.88 m)   Wt 180 lb (81.6 kg)   SpO2 98%   BMI 23.11 kg/m   Physical Exam  Constitutional: He is oriented to person, place, and time and well-developed, well-nourished, and in no distress. No distress.  Cardiovascular: Normal rate, regular rhythm and normal heart sounds.   Neurological: He is alert and oriented to person, place, and time. GCS score is 15.  Skin: Skin is warm and dry.     Psychiatric: Mood, memory, affect and judgment normal.  Vitals reviewed.   Results for orders placed or performed in visit on 07/21/17  POCT CBC  Result Value Ref Range   WBC 7.3 4.6 - 10.2 K/uL   Lymph, poc 1.8 0.6 - 3.4   POC LYMPH PERCENT 24.6 10 - 50 %L   MID (cbc) 0.6 0 - 0.9   POC MID % 7.7 0 - 12 %M   POC Granulocyte 4.9 2 - 6.9   Granulocyte percent 67.7 37 - 80 %G   RBC 4.29 (A) 4.69 - 6.13 M/uL   Hemoglobin 14.3 14.1 - 18.1 g/dL   HCT, POC 40.9 (A) 81.1 - 53.7 %   MCV 97.5 (A) 80 - 97 fL   MCH, POC 33.4 (A) 27 - 31.2 pg   MCHC 34.2 31.8 - 35.4 g/dL   RDW, POC 91.4 %   Platelet Count, POC 253 142 - 424 K/uL   MPV 7.4  0 - 99.8 fL    Assessment and Plan :  1. Cellulitis of left upper extremity - POCT CBC - Pt here for f/u possible olecranon bursitis. He was here 3 days ago for this problem and treated with Rocephin and Rx for Doxy.  Cellulitis is improving as expected as well as WBC count. He is clinically improving. He is still taking Doxycycline as directed. RTC PRN. ED precautions discussed. He understands.   Marco Collie, PA-C  Primary Care at [facility] 07/21/2017 3:11 PM

## 2017-09-27 DIAGNOSIS — H5711 Ocular pain, right eye: Secondary | ICD-10-CM | POA: Diagnosis not present

## 2017-09-27 DIAGNOSIS — S0501XA Injury of conjunctiva and corneal abrasion without foreign body, right eye, initial encounter: Secondary | ICD-10-CM | POA: Diagnosis not present

## 2017-09-27 DIAGNOSIS — H5789 Other specified disorders of eye and adnexa: Secondary | ICD-10-CM | POA: Diagnosis not present

## 2018-07-09 IMAGING — DX DG ELBOW COMPLETE 3+V*L*
4 series · 4 of 4 positions shown · non-contrast
Comparison: 04/16/2007

CLINICAL DATA: Swelling, redness and warmth for 1 day.

EXAM:
LEFT ELBOW - COMPLETE 3+ VIEW

[elbow ap]
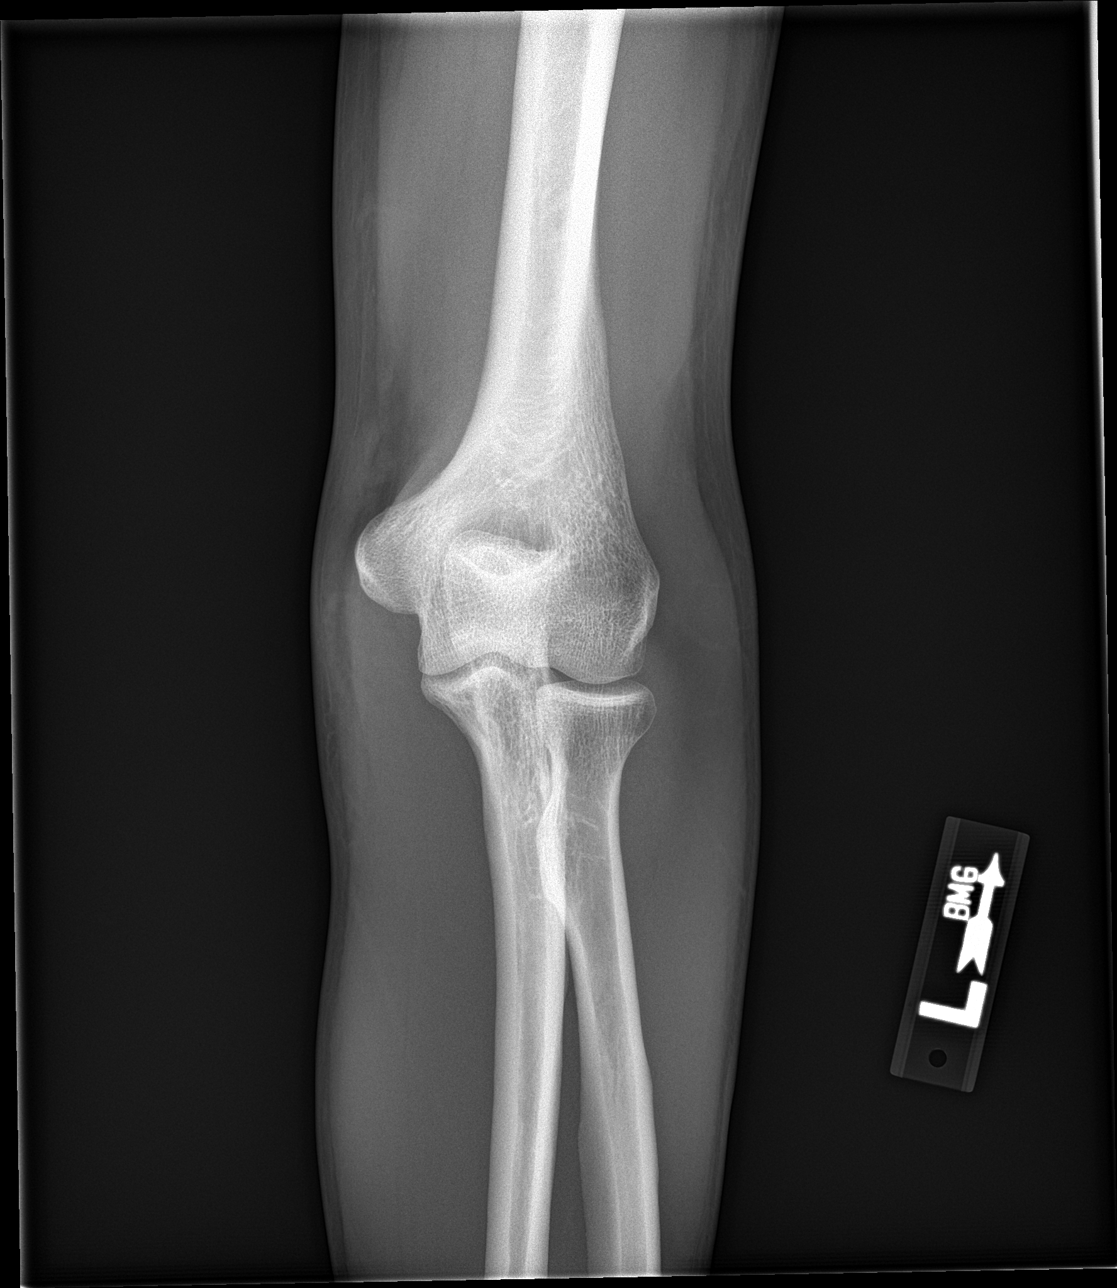

[elbow obl (1 of 2)]
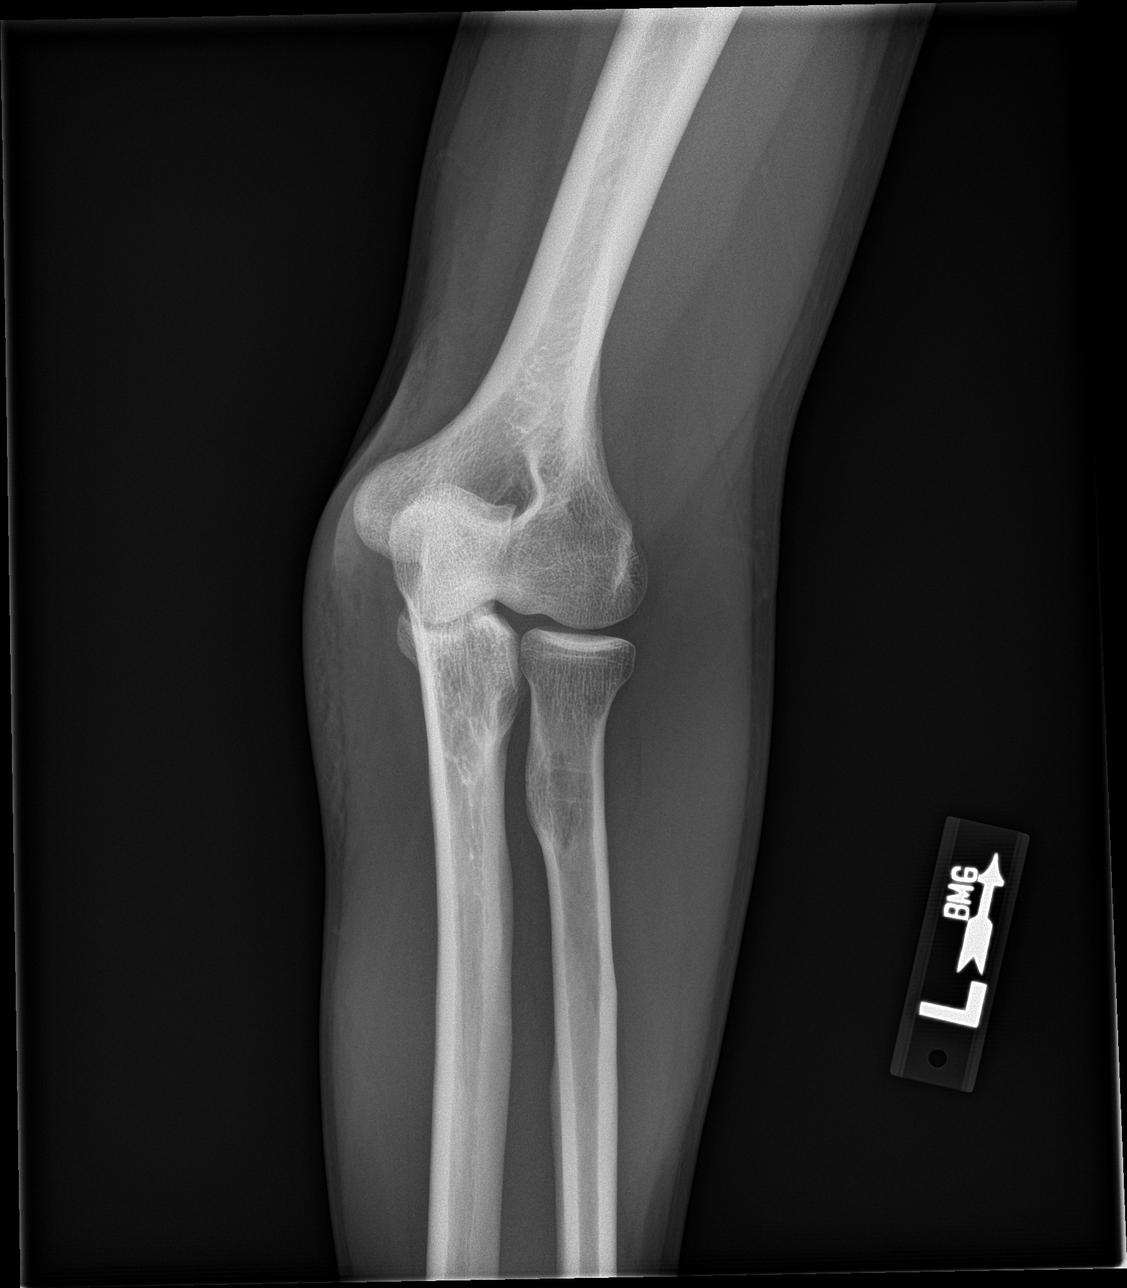

[elbow obl (2 of 2)]
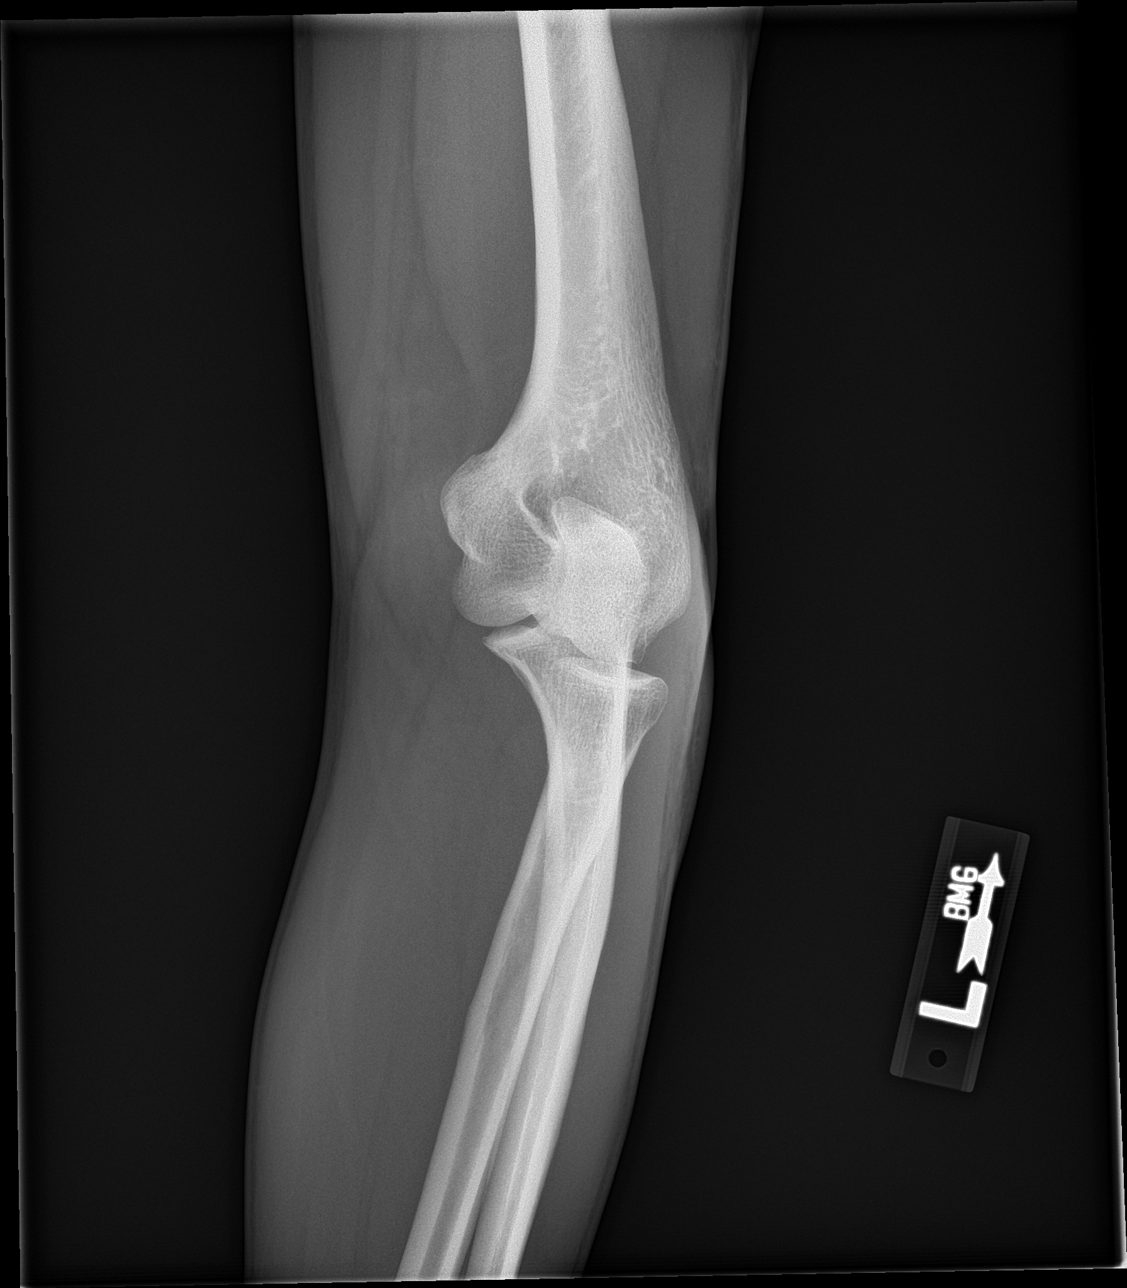

[elbow lat]
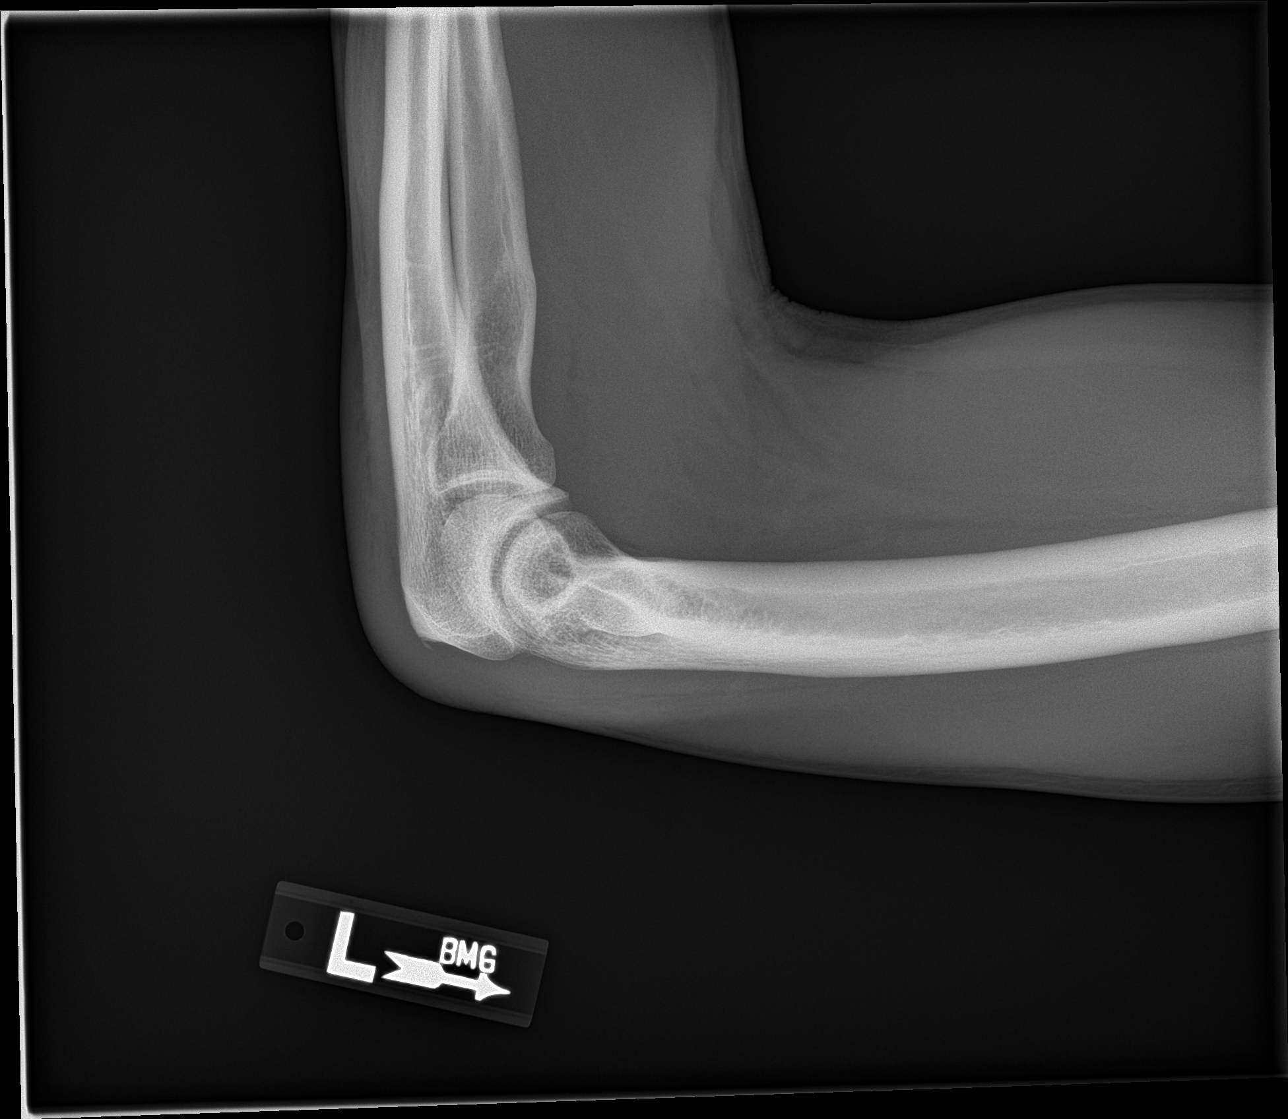

[4 of 4 positions shown; findings below may reference images not displayed]

FINDINGS: The joint spaces are maintained. No acute bony findings or
osteochondral abnormality. There is a tiny olecranon spur. No joint
effusion.

Soft tissue swelling noted over the olecranon region. Findings could
suggest olecranon bursitis.
IMPRESSION: No acute bony findings or joint effusion.

Swelling over the olecranon region could suggest olecranon bursitis.

## 2021-08-07 LAB — HM HEPATITIS C SCREENING LAB: HM Hepatitis Screen: NEGATIVE

## 2021-08-07 LAB — HM HIV SCREENING LAB: HM HIV Screening: NEGATIVE

## 2021-11-06 NOTE — Progress Notes (Signed)
New Patient Office Visit  Subjective:  Patient ID: Alan Lawson, male    DOB: 01/27/1976  Age: 46 y.o. MRN: [id number]  CC:  Chief Complaint  Patient presents with   Establish Care    NP. Est care. Request CPE. Pt is fasting    HPI Alan Lawson presents for new patient visit to establish care.  Introduced to Publishing rights manager role and practice setting.  All questions answered.  Discussed provider/patient relationship and expectations.  His main concern is that he noticed pain and pulsing in his neck after running around Christmas. His blood pressure was elevated at 162/95. He does not have a history of high blood pressure. He stated that his blood pressure stayed elevated for about a few weeks, then the last few weeks it was back in normal range. He is a long distance runner and runs 1/2 marathons and marathons routinely.   He also has a history of PFO that was discovered in 2005. 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 He went to the ER, and had a TEE which found a 32mm and didn't need closing. He did not need further monitoring of this.   Depression screen [facility] 2/9 11/08/2021 07/21/2017 07/18/2017 11/07/2015  Decreased Interest 0 0 0 0  Down, Depressed, Hopeless 0 0 0 0  PHQ - 2 Score 0 0 0 0  Altered sleeping 1 - - -  Tired, decreased energy 0 - - -  Change in appetite 0 - - -  Feeling bad or failure about yourself  0 - - -  Trouble concentrating 0 - - -  Moving slowly or fidgety/restless 0 - - -  Suicidal thoughts 0 - - -  PHQ-9 Score 1 - - -  Difficult doing work/chores Not difficult at all - - -   GAD 7 : Generalized Anxiety Score 11/08/2021  Nervous, Anxious, on Edge 0  Control/stop worrying 0  Worry too much - different things 0  Trouble relaxing 0  Restless 0  Easily annoyed or irritable 0  Afraid - awful might happen 0  Total GAD 7 Score 0  Anxiety Difficulty Not difficult at all    Past Medical History:  Diagnosis Date   IBS  (irritable bowel syndrome)    PFO (patent foramen ovale)    documented by bubble study setting of a migraine headache   Psoriasis     Past Surgical History:  Procedure Laterality Date   SHOULDER SURGERY Left 2010    Family History  Problem Relation Age of Onset   Heart disease Father     Social History   Socioeconomic History   Marital status: Married    Spouse name: Not on file   Number of children: Not on file   Years of education: Not on file   Highest education level: Not on file  Occupational History   Not on file  Tobacco Use   Smoking status: Never   Smokeless tobacco: Never  Vaping Use   Vaping Use: Never used  Substance and Sexual Activity   Alcohol use: Yes    Alcohol/week: 0.0 standard drinks    Comment: occasional   Drug use: No   Sexual activity: Not on file  Other Topics Concern   Not on file  Social History Narrative   Not on file   Social Determinants of Health   Financial Resource Strain: Not on file  Food Insecurity: Not on file  Transportation Needs:  Not on file  Physical Activity: Not on file  Stress: Not on file  Social Connections: Not on file  Intimate Partner Violence: Not on file    ROS Review of Systems  Constitutional: Negative.   HENT: Negative.    Eyes: Negative.   Respiratory: Negative.    Cardiovascular: Negative.   Gastrointestinal:  Positive for constipation and diarrhea.  Endocrine: Negative.   Genitourinary: Negative.   Musculoskeletal:  Positive for arthralgias (knees, chronic, stable).  Skin:  Positive for rash.       Intermittent psoriasis on his scalp, ears, and groin  Neurological: Negative.   Psychiatric/Behavioral: Negative.     Objective:   Today's Vitals: BP 138/80 (BP Location: Right Arm, Cuff Size: Normal)    Pulse 63    Temp 97.8 F (36.6 C) (Temporal)    Ht 6\' 4"  (1.93 m)    Wt 197 lb 9.6 oz (89.6 kg)    SpO2 96%    BMI 24.05 kg/m   Physical Exam Vitals and nursing note reviewed.   Constitutional:      Appearance: Normal appearance.  HENT:     Head: Normocephalic and atraumatic.     Right Ear: Tympanic membrane, ear canal and external ear normal.     Left Ear: Tympanic membrane, ear canal and external ear normal.     Nose: Nose normal.     Mouth/Throat:     Mouth: Mucous membranes are moist.     Pharynx: Oropharynx is clear.  Eyes:     Conjunctiva/sclera: Conjunctivae normal.  Cardiovascular:     Rate and Rhythm: Normal rate and regular rhythm.     Pulses: Normal pulses.     Heart sounds: Normal heart sounds.  Pulmonary:     Effort: Pulmonary effort is normal.     Breath sounds: Normal breath sounds.  Abdominal:     General: Bowel sounds are normal.     Palpations: Abdomen is soft.     Tenderness: There is no abdominal tenderness.  Musculoskeletal:        General: Normal range of motion.     Cervical back: Normal range of motion and neck supple. No tenderness.     Right lower leg: No edema.     Left lower leg: No edema.  Lymphadenopathy:     Cervical: No cervical adenopathy.  Skin:    General: Skin is warm and dry.  Neurological:     General: No focal deficit present.     Mental Status: He is alert and oriented to person, place, and time.     Cranial Nerves: No cranial nerve deficit.     Gait: Gait normal.     Deep Tendon Reflexes: Reflexes normal.  Psychiatric:        Mood and Affect: Mood normal.        Behavior: Behavior normal.        Thought Content: Thought content normal.        Judgment: Judgment normal.    Assessment & Plan:   Problem List Items Addressed This Visit       Cardiovascular and Mediastinum   PFO (patent foramen ovale)    History of PFO found on TEE when he had hemiplegic migraine. He saw cardiology and no surgery was needed at the time. If any future symptoms or concerns, will refer back to cardiology.         Other   Elevated blood pressure reading    Blood pressure elevated in office at  138/80. Discussed diet,  which it sounds like he already maintains a low sodium diet and he runs long distances frequently. Continue to monitor blood pressure at home at least a few times a week and call if >140/90 for several days. Check CMP, CBC today.       Other Visit Diagnoses     Routine general medical examination at a health care facility    -  Primary   Health maintenance reviewed and updated. CMP, CBC, TSH today   Relevant Orders   CBC with Differential/Platelet   Comprehensive metabolic panel   TSH   Need for diphtheria-tetanus-pertussis (Tdap) vaccine       Tdap booster updated today   Relevant Orders   Tdap vaccine greater than or equal to 7yo IM (Completed)   Encounter for lipid screening for cardiovascular disease       Screen lipid panel today   Relevant Orders   Lipid panel      IMMUNIZATIONS:   - Tdap: Tetanus vaccination status reviewed: Td vaccination indicated and given today. - Influenza: Up to date - Pneumovax: Not applicable - Prevnar: Not applicable - HPV: Not applicable - Zostavax vaccine: Not applicable  SCREENING: - Colonoscopy: Not applicable  Discussed with patient purpose of the colonoscopy is to detect colon cancer at curable precancerous or early stages   - AAA Screening: Not applicable  -Hearing Test: Not applicable  -Spirometry: Not applicable   PATIENT COUNSELING:    Sexuality: Discussed sexually transmitted diseases, partner selection, use of condoms, avoidance of unintended pregnancy  and contraceptive alternatives.   Advised to avoid cigarette smoking.  I discussed with the patient that most people either abstain from alcohol or drink within safe limits (<=14/week and <=4 drinks/occasion for males, <=7/weeks and <= 3 drinks/occasion for females) and that the risk for alcohol disorders and other health effects rises proportionally with the number of drinks per week and how often a drinker exceeds daily limits.  Discussed cessation/primary prevention of drug  use and availability of treatment for abuse.   Diet: Encouraged to adjust caloric intake to maintain  or achieve ideal body weight, to reduce intake of dietary saturated fat and total fat, to limit sodium intake by avoiding high sodium foods and not adding table salt, and to maintain adequate dietary potassium and calcium preferably from fresh fruits, vegetables, and low-fat dairy products.    stressed the importance of regular exercise  Injury prevention: Discussed safety belts, safety helmets, smoke detector, smoking near bedding or upholstery.   Dental health: Discussed importance of regular tooth brushing, flossing, and dental visits.   Follow up plan: NEXT PREVENTATIVE PHYSICAL DUE IN 1 YEAR.  Outpatient Encounter Medications as of 11/08/2021  Medication Sig   [DISCONTINUED] clobetasol ointment (TEMOVATE) 0.05 % Apply 1 application topically 2 (two) times daily.   clobetasol ointment (TEMOVATE) 0.05 % Apply 1 application topically 2 (two) times daily as needed.   ibuprofen (ADVIL,MOTRIN) 200 MG tablet Take 200 mg by mouth every 6 (six) hours as needed for moderate pain.    [DISCONTINUED] Calcium Carbonate-Vitamin D (CALCIUM + D PO) Take by mouth.   [DISCONTINUED] doxycycline (VIBRAMYCIN) 100 MG capsule Take 1 capsule (100 mg total) by mouth 2 (two) times daily.   [DISCONTINUED] GLUCOSAMINE HCL PO Take by mouth.   [DISCONTINUED] triamcinolone cream (KENALOG) 0.1 % Apply topically 2 (two) times daily. (Patient not taking: Reported on 11/08/2021)   No facility-administered encounter medications on file as of 11/08/2021.    Follow-up: Return  in about 1 year (around 11/08/2022) for CPE.   Gerre ScullLauren A Shelma Eiben, NP

## 2021-11-08 ENCOUNTER — Ambulatory Visit: Payer: 59 | Admitting: Nurse Practitioner

## 2021-11-08 ENCOUNTER — Encounter: Payer: Self-pay | Admitting: Nurse Practitioner

## 2021-11-08 ENCOUNTER — Other Ambulatory Visit: Payer: Self-pay

## 2021-11-08 VITALS — BP 138/80 | HR 63 | Temp 97.8°F | Ht 76.0 in | Wt 197.6 lb

## 2021-11-08 DIAGNOSIS — Z114 Encounter for screening for human immunodeficiency virus [HIV]: Secondary | ICD-10-CM

## 2021-11-08 DIAGNOSIS — Z0000 Encounter for general adult medical examination without abnormal findings: Secondary | ICD-10-CM

## 2021-11-08 DIAGNOSIS — Z13220 Encounter for screening for lipoid disorders: Secondary | ICD-10-CM

## 2021-11-08 DIAGNOSIS — R030 Elevated blood-pressure reading, without diagnosis of hypertension: Secondary | ICD-10-CM | POA: Diagnosis not present

## 2021-11-08 DIAGNOSIS — Z7689 Persons encountering health services in other specified circumstances: Secondary | ICD-10-CM

## 2021-11-08 DIAGNOSIS — Q2112 Patent foramen ovale: Secondary | ICD-10-CM

## 2021-11-08 DIAGNOSIS — Z136 Encounter for screening for cardiovascular disorders: Secondary | ICD-10-CM | POA: Diagnosis not present

## 2021-11-08 DIAGNOSIS — Z23 Encounter for immunization: Secondary | ICD-10-CM | POA: Diagnosis not present

## 2021-11-08 DIAGNOSIS — Z1159 Encounter for screening for other viral diseases: Secondary | ICD-10-CM

## 2021-11-08 DIAGNOSIS — E785 Hyperlipidemia, unspecified: Secondary | ICD-10-CM

## 2021-11-08 LAB — COMPREHENSIVE METABOLIC PANEL
ALT: 24 U/L (ref 0–53)
AST: 17 U/L (ref 0–37)
Albumin: 4.7 g/dL (ref 3.5–5.2)
Alkaline Phosphatase: 76 U/L (ref 39–117)
BUN: 19 mg/dL (ref 6–23)
CO2: 31 mEq/L (ref 19–32)
Calcium: 9.6 mg/dL (ref 8.4–10.5)
Chloride: 102 mEq/L (ref 96–112)
Creatinine, Ser: 0.74 mg/dL (ref 0.40–1.50)
GFR: 110.47 mL/min (ref >60.00)
Glucose, Bld: 90 mg/dL (ref 70–99)
Potassium: 4.5 mEq/L (ref 3.5–5.1)
Sodium: 138 mEq/L (ref 135–145)
Total Bilirubin: 0.4 mg/dL (ref 0.2–1.2)
Total Protein: 7.4 g/dL (ref 6.0–8.3)

## 2021-11-08 LAB — LIPID PANEL
Cholesterol: 194 mg/dL (ref 0–200)
HDL: 45.2 mg/dL (ref >39.00)
NonHDL: 148.84
Total CHOL/HDL Ratio: 4
Triglycerides: 208 mg/dL — ABNORMAL HIGH (ref 0.0–149.0)
VLDL: 41.6 mg/dL — ABNORMAL HIGH (ref 0.0–40.0)

## 2021-11-08 LAB — LDL CHOLESTEROL, DIRECT: Direct LDL: 134 mg/dL

## 2021-11-08 LAB — CBC WITH DIFFERENTIAL/PLATELET
Basophils Absolute: 0 10*3/uL (ref 0.0–0.1)
Basophils Relative: 0.4 % (ref 0.0–3.0)
Eosinophils Absolute: 0.1 10*3/uL (ref 0.0–0.7)
Eosinophils Relative: 1.7 % (ref 0.0–5.0)
HCT: 45.7 % (ref 39.0–52.0)
Hemoglobin: 15.1 g/dL (ref 13.0–17.0)
Lymphocytes Relative: 22.2 % (ref 12.0–46.0)
Lymphs Abs: 1.2 10*3/uL (ref 0.7–4.0)
MCHC: 33 g/dL (ref 30.0–36.0)
MCV: 98.5 fl (ref 78.0–100.0)
Monocytes Absolute: 0.6 10*3/uL (ref 0.1–1.0)
Monocytes Relative: 10.6 % (ref 3.0–12.0)
Neutro Abs: 3.4 10*3/uL (ref 1.4–7.7)
Neutrophils Relative %: 65.1 % (ref 43.0–77.0)
Platelets: 235 10*3/uL (ref 150.0–400.0)
RBC: 4.64 Mil/uL (ref 4.22–5.81)
RDW: 12.2 % (ref 11.5–15.5)
WBC: 5.2 10*3/uL (ref 4.0–10.5)

## 2021-11-08 LAB — TSH: TSH: 1.14 u[IU]/mL (ref 0.35–5.50)

## 2021-11-08 MED ORDER — CLOBETASOL PROPIONATE 0.05 % EX OINT: 1.0000 "application " | TOPICAL_OINTMENT | Freq: Two times a day (BID) | CUTANEOUS | 0 refills | Status: DC | PRN

## 2021-11-08 NOTE — Patient Instructions (Addendum)
It was great to see you!  Keep checking your blood pressure a few times a week. If you notice it above 140/90 consistently, or develop the neck throbbing you had in December, call our office/schedule an appointment.   We are going to check your labs today. Since you lose a lot of salt when you run, make sure you are drinking gatorade or similar drink when running long distances.   Let's follow-up in 1 year, sooner if you have concerns.  If a referral was placed today, you will be contacted for an appointment. Please note that routine referrals can sometimes take up to 3-4 weeks to process. Please call our office if you haven't heard anything after this time frame.  Take care,  Rodman Pickle, NP

## 2021-11-08 NOTE — Assessment & Plan Note (Signed)
History of PFO found on TEE when he had hemiplegic migraine. He saw cardiology and no surgery was needed at the time. If any future symptoms or concerns, will refer back to cardiology.

## 2021-11-08 NOTE — Assessment & Plan Note (Signed)
Blood pressure elevated in office at 138/80. Discussed diet, which it sounds like he already maintains a low sodium diet and he runs long distances frequently. Continue to monitor blood pressure at home at least a few times a week and call if >140/90 for several days. Check CMP, CBC today.

## 2023-04-09 ENCOUNTER — Encounter: Payer: Self-pay | Admitting: Nurse Practitioner

## 2023-04-09 ENCOUNTER — Ambulatory Visit: Payer: 59 | Admitting: Nurse Practitioner

## 2023-04-09 VITALS — BP 134/96 | HR 73 | Temp 98.0°F | Resp 16 | Ht 76.0 in | Wt 209.0 lb

## 2023-04-09 DIAGNOSIS — I10 Essential (primary) hypertension: Secondary | ICD-10-CM

## 2023-04-09 DIAGNOSIS — R002 Palpitations: Secondary | ICD-10-CM | POA: Diagnosis not present

## 2023-04-09 MED ORDER — LISINOPRIL 10 MG PO TABS: 10.0000 mg | ORAL_TABLET | Freq: Every day | ORAL | 1 refills | Status: DC

## 2023-04-09 NOTE — Patient Instructions (Signed)
It was great to see you!  Try to limit the amount of salt in your diet. Slowly increase exercise.   Start lisinopril 1 tablet daily.   Keep checking your blood pressure.   Let's follow-up in 2 weeks, sooner if you have concerns.  If a referral was placed today, you will be contacted for an appointment. Please note that routine referrals can sometimes take up to 3-4 weeks to process. Please call our office if you haven't heard anything after this time frame.  Take care,  Rodman Pickle, NP

## 2023-04-09 NOTE — Progress Notes (Unsigned)
Established Patient Office Visit  Subjective   Patient ID: Alan Lawson, male    DOB: December 01, 1975  Age: 47 y.o. MRN: [id number]  Chief Complaint  Patient presents with   Hypertension    Lunch time it was 167/93  in the past couple days it was 189/93.  C/o pressure at back of neck and headaches .     HPI  Alan Lawson is here today for elevated blood pressure.  He states that the last week, he was not feeling well, experiencing frequent headaches and pressure in the back of his head and neck.  He started checking his blood pressure and it was elevated at 167/93, then the other day was 189/93.  He states that when he donated blood on May 29, his blood pressure was in the 140s when they checked it.  He states that he has not been eating as healthy nor exercising like he used to.  He also notes that he has been having an increase in his palpitations.  He states that they can happen several times a week.  He denies chest pain and shortness of breath.    ROS See pertinent positives and negatives per HPI.    Objective:     BP (!) 134/96 (BP Location: Left Arm, Cuff Size: Large)   Pulse 73   Temp 98 F (36.7 C) (Temporal)   Resp 16   Ht 6\' 4"  (1.93 m)   Wt 209 lb (94.8 kg)   SpO2 98%   BMI 25.44 kg/m  BP Readings from Last 3 Encounters:  04/09/23 (!) 134/96  11/08/21 138/80  07/21/17 120/72   Wt Readings from Last 3 Encounters:  04/09/23 209 lb (94.8 kg)  11/08/21 197 lb 9.6 oz (89.6 kg)  07/21/17 180 lb (81.6 kg)      Physical Exam Vitals and nursing note reviewed.  Constitutional:      Appearance: Normal appearance.  HENT:     Head: Normocephalic.  Eyes:     Conjunctiva/sclera: Conjunctivae normal.  Neck:     Vascular: No carotid bruit.  Cardiovascular:     Rate and Rhythm: Normal rate and regular rhythm.     Pulses: Normal pulses.     Heart sounds: Normal heart sounds.  Pulmonary:     Effort: Pulmonary effort is normal.     Breath sounds: Normal breath  sounds.  Musculoskeletal:     Cervical back: Normal range of motion.     Right lower leg: No edema.     Left lower leg: No edema.  Skin:    General: Skin is warm.  Neurological:     General: No focal deficit present.     Mental Status: He is alert and oriented to person, place, and time.  Psychiatric:        Mood and Affect: Mood normal.        Behavior: Behavior normal.        Thought Content: Thought content normal.        Judgment: Judgment normal.     The 10-year ASCVD risk score (Arnett DK, et al., 2019) is: 7.4%    Assessment & Plan:   Problem List Items Addressed This Visit       Cardiovascular and Mediastinum   Primary hypertension - Primary    Newly diagnosed hypertension. Blood pressure 2 months ago 140s while donating blood. Then this past week blood pressure has ranged from 130s-180s/90s. He has been experiencing headaches and pulsing neck pain.  With symptoms, will start him on lisinopril 10mg  daily. Discussed limiting salt in his diet and slowly increasing exercise. Continue checking blood pressure daily. Check CMP, CBC, lipid panel today. Follow-up in 2-3 weeks.       Relevant Medications   lisinopril (ZESTRIL) 10 MG tablet   Other Relevant Orders   EKG 12-Lead (Completed)   CBC with Differential/Platelet (Completed)   Comprehensive metabolic panel (Completed)   Lipid panel (Completed)     Other   Palpitation    He has noticed an increase in palpitations. They are occurring at least weekly, if not almost every day. EKG today shows normal sinus rhythm, no ST or T wave changes. Encourage fluids. Follow-up if symptoms worsen or occur more frequently.       Relevant Orders   EKG 12-Lead (Completed)    Return in about 3 weeks (around 04/30/2023) for 2-3 weeks , HTN.    Gerre Scull, NP

## 2023-04-10 DIAGNOSIS — I10 Essential (primary) hypertension: Secondary | ICD-10-CM | POA: Insufficient documentation

## 2023-04-10 DIAGNOSIS — R002 Palpitations: Secondary | ICD-10-CM | POA: Insufficient documentation

## 2023-04-10 LAB — COMPREHENSIVE METABOLIC PANEL
AG Ratio: 1.5 (calc) (ref 1.0–2.5)
ALT: 37 U/L (ref 9–46)
AST: 21 U/L (ref 10–40)
Albumin: 4.5 g/dL (ref 3.6–5.1)
Alkaline phosphatase (APISO): 74 U/L (ref 36–130)
BUN: 20 mg/dL (ref 7–25)
CO2: 26 mmol/L (ref 20–32)
Calcium: 9.5 mg/dL (ref 8.6–10.3)
Chloride: 103 mmol/L (ref 98–110)
Creat: 0.91 mg/dL (ref 0.60–1.29)
Globulin: 3 g/dL (calc) (ref 1.9–3.7)
Glucose, Bld: 86 mg/dL (ref 65–99)
Potassium: 4.1 mmol/L (ref 3.5–5.3)
Sodium: 138 mmol/L (ref 135–146)
Total Bilirubin: 0.6 mg/dL (ref 0.2–1.2)
Total Protein: 7.5 g/dL (ref 6.1–8.1)

## 2023-04-10 LAB — CBC WITH DIFFERENTIAL/PLATELET
Absolute Monocytes: 725 cells/uL (ref 200–950)
Basophils Absolute: 48 cells/uL (ref 0–200)
Basophils Relative: 0.7 %
Eosinophils Absolute: 90 cells/uL (ref 15–500)
Eosinophils Relative: 1.3 %
HCT: 43.4 % (ref 38.5–50.0)
Hemoglobin: 14.7 g/dL (ref 13.2–17.1)
Lymphs Abs: 1904 cells/uL (ref 850–3900)
MCH: 31.4 pg (ref 27.0–33.0)
MCHC: 33.9 g/dL (ref 32.0–36.0)
MCV: 92.7 fL (ref 80.0–100.0)
MPV: 10.9 fL (ref 7.5–12.5)
Monocytes Relative: 10.5 %
Neutro Abs: 4133 cells/uL (ref 1500–7800)
Neutrophils Relative %: 59.9 %
Platelets: 303 10*3/uL (ref 140–400)
RBC: 4.68 10*6/uL (ref 4.20–5.80)
RDW: 13.3 % (ref 11.0–15.0)
Total Lymphocyte: 27.6 %
WBC: 6.9 10*3/uL (ref 3.8–10.8)

## 2023-04-10 LAB — LIPID PANEL
Cholesterol: 238 mg/dL — ABNORMAL HIGH (ref <200)
HDL: 60 mg/dL (ref >=40)
LDL Cholesterol (Calc): 142 mg/dL (calc) — ABNORMAL HIGH
Non-HDL Cholesterol (Calc): 178 mg/dL (calc) — ABNORMAL HIGH (ref <130)
Total CHOL/HDL Ratio: 4 (calc) (ref <5.0)
Triglycerides: 214 mg/dL — ABNORMAL HIGH (ref <150)

## 2023-04-10 NOTE — Assessment & Plan Note (Signed)
He has noticed an increase in palpitations. They are occurring at least weekly, if not almost every day. EKG today shows normal sinus rhythm, no ST or T wave changes. Encourage fluids. Follow-up if symptoms worsen or occur more frequently.

## 2023-04-10 NOTE — Assessment & Plan Note (Addendum)
Newly diagnosed hypertension. Blood pressure 2 months ago 140s while donating blood. Then this past week blood pressure has ranged from 130s-180s/90s. He has been experiencing headaches and pulsing neck pain. With symptoms, will start him on lisinopril 10mg  daily. Discussed limiting salt in his diet and slowly increasing exercise. Continue checking blood pressure daily. Check CMP, CBC, lipid panel today. Follow-up in 2-3 weeks.

## 2023-04-10 NOTE — Progress Notes (Signed)
EKG interpreted by me on 04/10/23 showed normal sinus rhythm with a heart rate of 63. No ST or T wave changes.   EKG not able to be seen here. Will be sent to be scanned.

## 2023-04-30 ENCOUNTER — Ambulatory Visit: Payer: 59 | Admitting: Nurse Practitioner

## 2023-04-30 ENCOUNTER — Encounter: Payer: Self-pay | Admitting: Nurse Practitioner

## 2023-04-30 VITALS — BP 124/82 | HR 67 | Temp 97.8°F | Ht 76.0 in | Wt 217.0 lb

## 2023-04-30 DIAGNOSIS — I10 Essential (primary) hypertension: Secondary | ICD-10-CM

## 2023-04-30 DIAGNOSIS — Z1211 Encounter for screening for malignant neoplasm of colon: Secondary | ICD-10-CM

## 2023-04-30 MED ORDER — LISINOPRIL 10 MG PO TABS: 10.0000 mg | ORAL_TABLET | Freq: Every day | ORAL | 1 refills | Status: DC

## 2023-04-30 NOTE — Progress Notes (Signed)
   Established Patient Office Visit  Subjective   Patient ID: Alan Lawson, male    DOB: [birth year]  Age: 47 y.o. MRN: [id number]  Chief Complaint  Patient presents with   Hypertension    Follow up with new medication    HPI  Alan Lawson is here to follow-up on hypertension.   He states that his blood pressure is doing better and his headaches have gone away.  He has not have any side effects to the lisinopril.  He has been checking his blood pressure and is 130s/80s at home.  He denies chest pain, shortness of breath, headaches.     ROS See pertinent positives and negatives per HPI.    Objective:     BP 124/82 (BP Location: Right Arm)   Pulse 67   Temp 97.8 F (36.6 C)   Ht 6\' 4"  (1.93 m)   Wt 217 lb (98.4 kg) Comment: Patient was wearing uniform and equipment  SpO2 96%   BMI 26.41 kg/m  BP Readings from Last 3 Encounters:  04/30/23 124/82  04/09/23 (!) 134/96  11/08/21 138/80   Wt Readings from Last 3 Encounters:  04/30/23 217 lb (98.4 kg)  04/09/23 209 lb (94.8 kg)  11/08/21 197 lb 9.6 oz (89.6 kg)      Physical Exam Vitals and nursing note reviewed.  Constitutional:      Appearance: Normal appearance.  HENT:     Head: Normocephalic.  Eyes:     Conjunctiva/sclera: Conjunctivae normal.  Cardiovascular:     Rate and Rhythm: Normal rate and regular rhythm.     Pulses: Normal pulses.     Heart sounds: Normal heart sounds.  Pulmonary:     Effort: Pulmonary effort is normal.     Breath sounds: Normal breath sounds.  Musculoskeletal:     Cervical back: Normal range of motion.  Skin:    General: Skin is warm.  Neurological:     General: No focal deficit present.     Mental Status: He is alert and oriented to person, place, and time.  Psychiatric:        Mood and Affect: Mood normal.        Behavior: Behavior normal.        Thought Content: Thought content normal.        Judgment: Judgment normal.    The 10-year ASCVD risk score (Arnett DK, et al.,  2019) is: 2.4%    Assessment & Plan:   Problem List Items Addressed This Visit       Cardiovascular and Mediastinum   Primary hypertension - Primary    Chronic, stable.  BP today 120/82.  Continue lisinopril 10 mg daily, refill sent to the pharmacy.  Check BMP today.  Follow-up in 3 months.      Relevant Medications   lisinopril (ZESTRIL) 10 MG tablet   Other Relevant Orders   Cologuard   Basic Metabolic Panel (BMET)   Other Visit Diagnoses     Screen for colon cancer       Cologuard ordered today.       Return in about 3 months (around 07/31/2023) for HTN, HLD.    Gerre Scull, NP

## 2023-04-30 NOTE — Assessment & Plan Note (Signed)
Chronic, stable.  BP today 120/82.  Continue lisinopril 10 mg daily, refill sent to the pharmacy.  Check BMP today.  Follow-up in 3 months.

## 2023-04-30 NOTE — Patient Instructions (Signed)
It was great to see you!  Keep taking the lisinopril every day.   We are checking your labs today and will let you know the results via mychart/phone.   Let's follow-up in 3 months, sooner if you have concerns.  If a referral was placed today, you will be contacted for an appointment. Please note that routine referrals can sometimes take up to 3-4 weeks to process. Please call our office if you haven't heard anything after this time frame.  Take care,  Rodman Pickle, NP

## 2023-05-01 LAB — BASIC METABOLIC PANEL
BUN: 14 mg/dL (ref 6–23)
CO2: 28 mEq/L (ref 19–32)
GFR: 109.33 mL/min (ref >60.00)
Glucose, Bld: 81 mg/dL (ref 70–99)

## 2023-05-20 ENCOUNTER — Telehealth: Payer: Self-pay | Admitting: Nurse Practitioner

## 2023-05-20 DIAGNOSIS — Z1211 Encounter for screening for malignant neoplasm of colon: Secondary | ICD-10-CM

## 2023-05-20 NOTE — Telephone Encounter (Signed)
[employer] is calling to inform Alan Lawson the order for pt's cologuard can not be shipped. The ICD code does not work for this order. [employer] at [phone], case #[id number]

## 2023-05-21 NOTE — Telephone Encounter (Signed)
I called [employer] and spoke with Luie the representative for [employer] and notified him that a new order was placed with the correct diagnosis. He said that he will take out the old order and replace with the new one.

## 2023-05-30 LAB — COLOGUARD: COLOGUARD: NEGATIVE

## 2023-08-01 ENCOUNTER — Ambulatory Visit: Payer: 59 | Admitting: Nurse Practitioner

## 2023-08-01 ENCOUNTER — Encounter: Payer: Self-pay | Admitting: Nurse Practitioner

## 2023-08-01 VITALS — BP 136/80 | HR 68 | Temp 97.5°F | Ht 76.0 in | Wt 213.8 lb

## 2023-08-01 DIAGNOSIS — I10 Essential (primary) hypertension: Secondary | ICD-10-CM | POA: Diagnosis not present

## 2023-08-01 DIAGNOSIS — E782 Mixed hyperlipidemia: Secondary | ICD-10-CM | POA: Diagnosis not present

## 2023-08-01 DIAGNOSIS — R229 Localized swelling, mass and lump, unspecified: Secondary | ICD-10-CM | POA: Diagnosis not present

## 2023-08-01 DIAGNOSIS — R143 Flatulence: Secondary | ICD-10-CM

## 2023-08-01 DIAGNOSIS — R42 Dizziness and giddiness: Secondary | ICD-10-CM

## 2023-08-01 LAB — LIPID PANEL
Cholesterol: 201 mg/dL — ABNORMAL HIGH (ref 0–200)
HDL: 48.7 mg/dL (ref >39.00)
LDL Cholesterol: 132 mg/dL — ABNORMAL HIGH (ref 0–99)
NonHDL: 152.37
Total CHOL/HDL Ratio: 4
Triglycerides: 101 mg/dL (ref 0.0–149.0)
VLDL: 20.2 mg/dL (ref 0.0–40.0)

## 2023-08-01 MED ORDER — LISINOPRIL 10 MG PO TABS: 10.0000 mg | ORAL_TABLET | Freq: Every day | ORAL | 3 refills | Status: DC

## 2023-08-01 NOTE — Patient Instructions (Signed)
VISIT SUMMARY:  During your visit, we discussed your concerns about elevated blood pressure, increased intestinal gas, faintness during blood donation, dietary changes, and a small lump on the back of your neck. We reviewed your current medications and made some recommendations to help manage your symptoms.  YOUR PLAN:  -HYPERTENSION: Hypertension means high blood pressure. Your blood pressure is slightly elevated at 136/80 but remains generally well-controlled on your current medication, Lisinopril. We will continue with this medication and refill your prescription.  -SYNCOPE DURING BLOOD DONATION: Syncope means fainting. You have been feeling faint and hot during recent blood donations. We recommend ensuring you stay well-hydrated and have a meal before donating. If these symptoms continue, consider holding off on blood donations.  -INCREASED INTESTINAL GAS: You have been experiencing increased gas, especially in the mornings, which is waking you up. We will start you on Gas-X (simethicone) before bed and encourage you to continue staying hydrated.  -DIETARY CHANGES: You have made positive changes to your diet by reducing fried foods and increasing fruits and vegetables. We encourage you to continue with these healthy dietary changes.  -NECK NODULE: You have a small, non-painful lump on the back of your neck that has not changed in size. We will monitor this for any changes in size or symptoms.  INSTRUCTIONS:  Please follow up in six months for a physical examination.

## 2023-08-01 NOTE — Progress Notes (Signed)
Established Patient Office Visit  Subjective   Patient ID: Alan Lawson, male    DOB: 29-Oct-1975  Age: 47 y.o. MRN: [id number]  Chief Complaint  Patient presents with   Hypertension    Follow up, concerns with intestinal gas that happens in early mornings   HPI:  Discussed the use of AI scribe software for clinical note transcription with the patient, who gave verbal consent to proceed.  History of Present Illness   The patient, with a history of hypertension and hyperlipidemia presents with concerns about elevated blood pressure readings and increased intestinal gas. They report that their blood pressure in the office slightly higher than usual, compared to their usual readings of around 128/80. They are currently on blood pressure medication, lisinopril 10mg  daily.  The patient also reports a recent change in their blood donation experience. They have been a regular blood donor, but recently they have been feeling faint and overheated during the process. This has occurred both before and after starting their blood pressure medication. They hydrate well before donating and are considering stopping due to these symptoms.  In terms of diet, the patient has been trying to reduce their intake of fried foods and increase their consumption of fruits and vegetables. They have lost four pounds recently. However, they have been experiencing increased intestinal gas, particularly in the mornings. This has been waking them up around 3-4 AM daily. They have a history of irritable bowel syndrome (IBS) and have been taking Prilosec, which has helped with acid reflux but not with the gas.  The patient also mentions a small, non-painful lump on the back of their neck that they noticed a couple of weeks ago. It has not changed in size.         ROS See pertinent positives and negatives per HPI.    Objective:     BP 136/80 (BP Location: Right Arm)   Pulse 68   Temp (!) 97.5 F (36.4 C)   Ht 6'  4" (1.93 m)   Wt 213 lb 12.8 oz (97 kg)   SpO2 97%   BMI 26.02 kg/m  BP Readings from Last 3 Encounters:  08/01/23 136/80  04/30/23 124/82  04/09/23 (!) 134/96   Wt Readings from Last 3 Encounters:  08/01/23 213 lb 12.8 oz (97 kg)  04/30/23 217 lb (98.4 kg)  04/09/23 209 lb (94.8 kg)      Physical Exam Vitals and nursing note reviewed.  Constitutional:      Appearance: Normal appearance.  HENT:     Head: Normocephalic.  Eyes:     Conjunctiva/sclera: Conjunctivae normal.  Cardiovascular:     Rate and Rhythm: Normal rate and regular rhythm.     Pulses: Normal pulses.     Heart sounds: Normal heart sounds.  Pulmonary:     Effort: Pulmonary effort is normal.     Breath sounds: Normal breath sounds.  Musculoskeletal:     Cervical back: Normal range of motion.  Skin:    General: Skin is warm.     Comments: Small cyst under skin on right posterior neck  Neurological:     General: No focal deficit present.     Mental Status: He is alert and oriented to person, place, and time.  Psychiatric:        Mood and Affect: Mood normal.        Behavior: Behavior normal.        Thought Content: Thought content normal.  Judgment: Judgment normal.    The 10-year ASCVD risk score (Arnett DK, et al., 2019) is: 2.8%    Assessment & Plan:   Problem List Items Addressed This Visit       Cardiovascular and Mediastinum   Primary hypertension    Chronic, stable. Their blood pressure is slightly elevated at 136/80 but remains generally well-controlled on Lisinopril. We will continue Lisinopril 10mg  daily and refill the prescription.      Relevant Medications   lisinopril (ZESTRIL) 10 MG tablet     Other   Mixed hyperlipidemia - Primary    Chronic, ongoing. He has adjusted his diet and eating out less often. Check lipid panel today (not fasting).       Relevant Medications   lisinopril (ZESTRIL) 10 MG tablet   Other Relevant Orders   Lipid panel   Light headed    They  report feeling faint and hot during recent blood donations. We will ensure they maintain adequate hydration and meal intake prior to donation and consider holding off on donations if symptoms persist.      Other Visit Diagnoses     Skin nodule       May be lymph node versus epidermal cyst. We will monitor for changes in size or symptoms.   Intestinal gas excretion       He tried prilosec whic hhas not helped. Will start Gas-x 2 tablets at bedtime and prn symptoms. Continue hydration.       Return in about 6 months (around 01/30/2024) for CPE.    Gerre Scull, NP

## 2023-08-01 NOTE — Assessment & Plan Note (Addendum)
Chronic, stable. Their blood pressure is slightly elevated at 136/80 but remains generally well-controlled on Lisinopril. We will continue Lisinopril 10mg  daily and refill the prescription.

## 2023-08-01 NOTE — Assessment & Plan Note (Signed)
They report feeling faint and hot during recent blood donations. We will ensure they maintain adequate hydration and meal intake prior to donation and consider holding off on donations if symptoms persist.

## 2023-08-01 NOTE — Assessment & Plan Note (Addendum)
Chronic, ongoing. He has adjusted his diet and eating out less often. Check lipid panel today (not fasting).

## 2023-08-29 ENCOUNTER — Ambulatory Visit
Admission: EM | Admit: 2023-08-29 | Discharge: 2023-08-29 | Disposition: A | Discharge status: Home / Self Care | Payer: 59 | Attending: Physician Assistant | Admitting: Physician Assistant

## 2023-08-29 DIAGNOSIS — H1031 Unspecified acute conjunctivitis, right eye: Secondary | ICD-10-CM | POA: Diagnosis not present

## 2023-08-29 MED ORDER — POLYMYXIN B-TRIMETHOPRIM 10000-0.1 UNIT/ML-% OP SOLN: 1.0000 [drp] | OPHTHALMIC | 0 refills | Status: AC

## 2023-08-29 NOTE — ED Triage Notes (Signed)
Patient presents to [facility] for right eye irritation since weds. Treating with OTC eye drops with minimal relief. Some watery drainage this morning. Wife noted iris to be cloudy and has some light sensitivity.  Denies fever or changes to vision.

## 2023-09-06 NOTE — ED Provider Notes (Signed)
[facility]    CSN: [id number] Arrival date & time: 08/29/23  1342      History   Chief Complaint Chief Complaint  Patient presents with   Eye Problem    HPI Alan Lawson is a 47 y.o. male.   Patient here today for evaluation of right eye irritation that started several days ago.  He has been using over-the-counter eyedrops without resolution.  He reports some watery drainage in the morning.  He has not had any changes to his vision.  He is also not had fever.  The history is provided by the patient.  Eye Problem Associated symptoms: discharge and redness   Associated symptoms: no numbness     Past Medical History:  Diagnosis Date   IBS (irritable bowel syndrome)    PFO (patent foramen ovale)    documented by bubble study setting of a migraine headache   Psoriasis     Patient Active Problem List   Diagnosis Date Noted   Mixed hyperlipidemia 08/01/2023   Light headed 08/01/2023   Primary hypertension 04/10/2023   Palpitation 04/10/2023   PFO (patent foramen ovale) 09/28/2015    Past Surgical History:  Procedure Laterality Date   SHOULDER SURGERY Left 2010       Home Medications    Prior to Admission medications   Medication Sig Start Date End Date Taking? Authorizing Provider  clobetasol ointment (TEMOVATE) 0.05 % Apply 1 application topically 2 (two) times daily as needed. 11/08/21   McElwee, Lauren A, NP  ibuprofen (ADVIL,MOTRIN) 200 MG tablet Take 200 mg by mouth every 6 (six) hours as needed for moderate pain.     [provider]  lisinopril (ZESTRIL) 10 MG tablet Take 1 tablet (10 mg total) by mouth daily. 08/01/23   McElwee, Lauren A, NP  Omeprazole Magnesium (PRILOSEC OTC PO) Take by mouth daily.    [provider]    Family History Family History  Problem Relation Age of Onset   Heart disease Father     Social History Social History   Tobacco Use   Smoking status: Never   Smokeless tobacco: Never  Vaping Use    Vaping status: Never Used  Substance Use Topics   Alcohol use: Yes    Alcohol/week: 0.0 standard drinks of alcohol    Comment: occasional   Drug use: No     Allergies   Patient has no known allergies.   Review of Systems Review of Systems  Constitutional:  Negative for chills and fever.  Eyes:  Positive for discharge and redness.  Neurological:  Negative for numbness.     Physical Exam Triage Vital Signs ED Triage Vitals  Encounter Vitals Group     BP 08/29/23 1401 (!) 147/88     Systolic BP Percentile --      Diastolic BP Percentile --      Pulse Rate 08/29/23 1401 69     Resp 08/29/23 1401 16     Temp 08/29/23 1401 98 F (36.7 C)     Temp Source 08/29/23 1401 Oral     SpO2 08/29/23 1401 97 %     Weight --      Height --      Head Circumference --      Peak Flow --      Pain Score 08/29/23 1400 6     Pain Loc --      Pain Education --      Exclude from Growth Chart --  No data found.  Updated Vital Signs BP (!) 147/88 (BP Location: Right Arm)   Pulse 69   Temp 98 F (36.7 C) (Oral)   Resp 16   SpO2 97%   Visual Acuity Right Eye Distance: 20/20 Left Eye Distance: 20/20 Bilateral Distance: 20/20  Right Eye Near:   Left Eye Near:    Bilateral Near:     Physical Exam Vitals and nursing note reviewed.  Constitutional:      General: He is not in acute distress.    Appearance: Normal appearance. He is not ill-appearing.  HENT:     Head: Normocephalic and atraumatic.  Eyes:     Extraocular Movements: Extraocular movements intact.     Pupils: Pupils are equal, round, and reactive to light.     Comments: Right conjunctiva injected  Cardiovascular:     Rate and Rhythm: Normal rate.  Pulmonary:     Effort: Pulmonary effort is normal. No respiratory distress.  Neurological:     Mental Status: He is alert.  Psychiatric:        Mood and Affect: Mood normal.        Behavior: Behavior normal.        Thought Content: Thought content normal.       UC Treatments / Results  Labs (all labs ordered are listed, but only abnormal results are displayed) Labs Reviewed - No data to display  EKG   Radiology No results found.  Procedures Procedures (including critical care time)  Medications Ordered in UC Medications - No data to display  Initial Impression / Assessment and Plan / UC Course  I have reviewed the triage vital signs and the nursing notes.  Pertinent labs & imaging results that were available during my care of the patient were reviewed by me and considered in my medical decision making (see chart for details).     Will treat to cover conjunctivitis with Polytrim drops.  Recommended follow-up if no gradual improvement with any further concerns. Final Clinical Impressions(s) / UC Diagnoses   Final diagnoses:  Acute conjunctivitis of right eye, unspecified acute conjunctivitis type   Discharge Instructions   None    ED Prescriptions     Medication Sig Dispense Auth. Provider   trimethoprim-polymyxin b (POLYTRIM) ophthalmic solution Place 1 drop into both eyes every 4 (four) hours for 7 days. 10 mL Tomi Bamberger, PA-C      PDMP not reviewed this encounter.   Tomi Bamberger, PA-C 09/06/23 [phone]

## 2024-01-30 ENCOUNTER — Encounter: Payer: 59 | Admitting: Nurse Practitioner

## 2024-02-02 ENCOUNTER — Encounter: Payer: Self-pay | Admitting: Nurse Practitioner

## 2024-02-02 ENCOUNTER — Ambulatory Visit (INDEPENDENT_AMBULATORY_CARE_PROVIDER_SITE_OTHER): Payer: 59 | Admitting: Nurse Practitioner

## 2024-02-02 VITALS — BP 136/84 | HR 51 | Temp 97.1°F | Ht 76.0 in | Wt 209.4 lb

## 2024-02-02 DIAGNOSIS — E782 Mixed hyperlipidemia: Secondary | ICD-10-CM | POA: Diagnosis not present

## 2024-02-02 DIAGNOSIS — I10 Essential (primary) hypertension: Secondary | ICD-10-CM | POA: Diagnosis not present

## 2024-02-02 DIAGNOSIS — Q2112 Patent foramen ovale: Secondary | ICD-10-CM | POA: Diagnosis not present

## 2024-02-02 DIAGNOSIS — Z0000 Encounter for general adult medical examination without abnormal findings: Secondary | ICD-10-CM | POA: Diagnosis not present

## 2024-02-02 LAB — CBC WITH DIFFERENTIAL/PLATELET
Basophils Absolute: 0 10*3/uL (ref 0.0–0.1)
Basophils Relative: 0.5 % (ref 0.0–3.0)
Eosinophils Absolute: 0.1 10*3/uL (ref 0.0–0.7)
Eosinophils Relative: 1.3 % (ref 0.0–5.0)
HCT: 41 % (ref 39.0–52.0)
Hemoglobin: 13.3 g/dL (ref 13.0–17.0)
Lymphocytes Relative: 23 % (ref 12.0–46.0)
Lymphs Abs: 1.3 10*3/uL (ref 0.7–4.0)
MCHC: 32.4 g/dL (ref 30.0–36.0)
MCV: 88 fl (ref 78.0–100.0)
Monocytes Absolute: 0.6 10*3/uL (ref 0.1–1.0)
Monocytes Relative: 10.4 % (ref 3.0–12.0)
Neutro Abs: 3.6 10*3/uL (ref 1.4–7.7)
Neutrophils Relative %: 64.8 % (ref 43.0–77.0)
Platelets: 300 10*3/uL (ref 150.0–400.0)
RBC: 4.66 Mil/uL (ref 4.22–5.81)
RDW: 15.7 % — ABNORMAL HIGH (ref 11.5–15.5)
WBC: 5.6 10*3/uL (ref 4.0–10.5)

## 2024-02-02 LAB — COMPREHENSIVE METABOLIC PANEL WITH GFR
ALT: 28 U/L (ref 0–53)
AST: 21 U/L (ref 0–37)
Albumin: 4.4 g/dL (ref 3.5–5.2)
Alkaline Phosphatase: 66 U/L (ref 39–117)
BUN: 12 mg/dL (ref 6–23)
CO2: 28 meq/L (ref 19–32)
Calcium: 9.3 mg/dL (ref 8.4–10.5)
Chloride: 100 meq/L (ref 96–112)
Creatinine, Ser: 0.69 mg/dL (ref 0.40–1.50)
GFR: 111.07 mL/min (ref >60.00)
Glucose, Bld: 87 mg/dL (ref 70–99)
Potassium: 4.5 meq/L (ref 3.5–5.1)
Sodium: 136 meq/L (ref 135–145)
Total Bilirubin: 0.6 mg/dL (ref 0.2–1.2)
Total Protein: 7.2 g/dL (ref 6.0–8.3)

## 2024-02-02 LAB — LIPID PANEL
Cholesterol: 238 mg/dL — ABNORMAL HIGH (ref 0–200)
HDL: 52.9 mg/dL (ref >39.00)
LDL Cholesterol: 161 mg/dL — ABNORMAL HIGH (ref 0–99)
NonHDL: 184.92
Total CHOL/HDL Ratio: 4
Triglycerides: 121 mg/dL (ref 0.0–149.0)
VLDL: 24.2 mg/dL (ref 0.0–40.0)

## 2024-02-02 MED ORDER — CLOBETASOL PROPIONATE 0.05 % EX OINT: 1.0000 | TOPICAL_OINTMENT | Freq: Two times a day (BID) | CUTANEOUS | 0 refills | Status: AC | PRN

## 2024-02-02 NOTE — Patient Instructions (Signed)
It was great to see you!  We are checking your labs today and will let you know the results via mychart/phone.   Keep up the great work!   Let's follow-up in 1 year, sooner if you have concerns.  If a referral was placed today, you will be contacted for an appointment. Please note that routine referrals can sometimes take up to 3-4 weeks to process. Please call our office if you haven't heard anything after this time frame.  Take care,  Nivin Braniff, NP  

## 2024-02-02 NOTE — Progress Notes (Signed)
 BP 136/84 (BP Location: Left Arm, Patient Position: Sitting, Cuff Size: Normal)   Pulse (!) 51   Temp (!) 97.1 F (36.2 C)   Ht 6\' 4"  (1.93 m)   Wt 209 lb 6.4 oz (95 kg)   SpO2 99%   BMI 25.49 kg/m    Subjective:    Patient ID: Alan Lawson, male    DOB: 10/04/1976, 48 y.o.   MRN: [id number]  CC: Chief Complaint  Patient presents with   Annual Exam    With fasting lab work, no concerns    HPI: Alan Lawson is a 48 y.o. male presenting on 02/02/2024 for comprehensive medical examination. Current medical complaints include:none  He currently lives with: wife, 2 kids  Depression and Anxiety Screen done today and results listed below:     02/02/2024   10:44 AM 04/09/2023    3:53 PM 11/08/2021    8:40 AM 07/21/2017    2:57 PM 07/18/2017    1:29 PM  Depression screen PHQ 2/9  Decreased Interest 0 0 0 0 0  Down, Depressed, Hopeless 0 0 0 0 0  PHQ - 2 Score 0 0 0 0 0  Altered sleeping 1  1    Tired, decreased energy 1  0    Change in appetite 0  0    Feeling bad or failure about yourself  0  0    Trouble concentrating 0  0    Moving slowly or fidgety/restless 0  0    Suicidal thoughts 0  0    PHQ-9 Score 2  1    Difficult doing work/chores Not difficult at all  Not difficult at all        02/02/2024   10:45 AM 11/08/2021    8:41 AM  GAD 7 : Generalized Anxiety Score  Nervous, Anxious, on Edge 0 0  Control/stop worrying 0 0  Worry too much - different things 0 0  Trouble relaxing 0 0  Restless 0 0  Easily annoyed or irritable 0 0  Afraid - awful might happen 0 0  Total GAD 7 Score 0 0  Anxiety Difficulty Not difficult at all Not difficult at all    The patient does not have a history of falls. I did not complete a risk assessment for falls. A plan of care for falls was not documented.   Past Medical History:  Past Medical History:  Diagnosis Date   GERD (gastroesophageal reflux disease)    Hypertension    IBS (irritable bowel syndrome)    PFO (patent foramen  ovale)    documented by bubble study setting of a migraine headache   Psoriasis     Surgical History:  Past Surgical History:  Procedure Laterality Date   SHOULDER SURGERY Left 2010    Medications:  Current Outpatient Medications on File Prior to Visit  Medication Sig   ibuprofen (ADVIL,MOTRIN) 200 MG tablet Take 200 mg by mouth every 6 (six) hours as needed for moderate pain.    lisinopril  (ZESTRIL ) 10 MG tablet Take 1 tablet (10 mg total) by mouth daily.   Omeprazole Magnesium (PRILOSEC OTC PO) Take by mouth daily.   No current facility-administered medications on file prior to visit.    Allergies:  No Known Allergies  Social History:  Social History   Socioeconomic History   Marital status: Married    Spouse name: Not on file   Number of children: Not on file   Years of education: Not  on file   Highest education level: Associate degree: occupational, Scientist, product/process development, or vocational program  Occupational History   Not on file  Tobacco Use   Smoking status: Never   Smokeless tobacco: Never  Vaping Use   Vaping status: Never Used  Substance and Sexual Activity   Alcohol use: Yes    Alcohol/week: 15.0 standard drinks of alcohol    Types: 15 Cans of beer per week    Comment: occasional   Drug use: No   Sexual activity: Not Currently    Birth control/protection: Surgical  Other Topics Concern   Not on file  Social History Narrative   Not on file   Social Drivers of Health   Financial Resource Strain: Low Risk  (02/01/2024)   Overall Financial Resource Strain (CARDIA)    Difficulty of Paying Living Expenses: Not hard at all  Food Insecurity: No Food Insecurity (02/01/2024)   Hunger Vital Sign    Worried About Running Out of Food in the Last Year: Never true    Ran Out of Food in the Last Year: Never true  Transportation Needs: No Transportation Needs (02/01/2024)   PRAPARE - Administrator, Civil Service (Medical): No    Lack of Transportation  (Non-Medical): No  Physical Activity: Sufficiently Active (02/01/2024)   Exercise Vital Sign    Days of Exercise per Week: 6 days    Minutes of Exercise per Session: 130 min  Stress: No Stress Concern Present (02/01/2024)   [employer] of Occupational Health - Occupational Stress Questionnaire    Feeling of Stress : Only a little  Social Connections: Moderately Integrated (02/01/2024)   Social Connection and Isolation Panel [NHANES]    Frequency of Communication with Friends and Family: Twice a week    Frequency of Social Gatherings with Friends and Family: Twice a week    Attends Religious Services: More than 4 times per year    Active Member of [employer] or Organizations: No    Attends Engineer, structural: Not on file    Marital Status: Married  Catering manager Violence: Not on file   Social History   Tobacco Use  Smoking Status Never  Smokeless Tobacco Never   Social History   Substance and Sexual Activity  Alcohol Use Yes   Alcohol/week: 15.0 standard drinks of alcohol   Types: 15 Cans of beer per week   Comment: occasional    Family History:  Family History  Problem Relation Age of Onset   Heart disease Father    Varicose Veins Father    ADD / ADHD Son     Past medical history, surgical history, medications, allergies, family history and social history reviewed with patient today and changes made to appropriate areas of the chart.   Review of Systems  Constitutional: Negative.   HENT: Negative.    Eyes: Negative.   Respiratory: Negative.    Cardiovascular: Negative.   Gastrointestinal:  Positive for constipation (intermittent).  Genitourinary: Negative.   Musculoskeletal: Negative.   Skin: Negative.   Neurological: Negative.   Psychiatric/Behavioral: Negative.     All other ROS negative except what is listed above and in the HPI.      Objective:    BP 136/84 (BP Location: Left Arm, Patient Position: Sitting, Cuff Size: Normal)   Pulse (!)  51   Temp (!) 97.1 F (36.2 C)   Ht 6\' 4"  (1.93 m)   Wt 209 lb 6.4 oz (95 kg)   SpO2 99%  BMI 25.49 kg/m   Wt Readings from Last 3 Encounters:  02/02/24 209 lb 6.4 oz (95 kg)  08/01/23 213 lb 12.8 oz (97 kg)  04/30/23 217 lb (98.4 kg)    Physical Exam Vitals and nursing note reviewed.  Constitutional:      General: He is not in acute distress.    Appearance: Normal appearance.  HENT:     Head: Normocephalic and atraumatic.     Right Ear: Tympanic membrane, ear canal and external ear normal.     Left Ear: Tympanic membrane, ear canal and external ear normal.     Mouth/Throat:     Mouth: Mucous membranes are moist.     Pharynx: No posterior oropharyngeal erythema.  Eyes:     Conjunctiva/sclera: Conjunctivae normal.  Cardiovascular:     Rate and Rhythm: Normal rate and regular rhythm.     Pulses: Normal pulses.     Heart sounds: Normal heart sounds.  Pulmonary:     Effort: Pulmonary effort is normal.     Breath sounds: Normal breath sounds.  Abdominal:     Palpations: Abdomen is soft.     Tenderness: There is no abdominal tenderness.  Musculoskeletal:        General: Normal range of motion.     Cervical back: Normal range of motion and neck supple. No tenderness.     Right lower leg: No edema.     Left lower leg: No edema.  Lymphadenopathy:     Cervical: No cervical adenopathy.  Skin:    General: Skin is warm and dry.  Neurological:     General: No focal deficit present.     Mental Status: He is alert and oriented to person, place, and time.     Cranial Nerves: No cranial nerve deficit.     Coordination: Coordination normal.     Gait: Gait normal.  Psychiatric:        Mood and Affect: Mood normal.        Behavior: Behavior normal.        Thought Content: Thought content normal.        Judgment: Judgment normal.     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 He saw cardiology and no surgery was needed at the time. If any future symptoms or concerns, will refer back to cardiology.       Primary hypertension   Chronic, stable. We will continue Lisinopril  10mg  daily. Check CMP, CBC today.       Relevant Orders   CBC with Differential/Platelet   Comprehensive metabolic panel with GFR     Other   Mixed hyperlipidemia   Chronic, stable. Check CMP, CBC, lipid panel today and treat based on results. Continue regular exercise and heart healthy eating.       Relevant Orders  CBC with Differential/Platelet   Comprehensive metabolic panel with GFR   Lipid panel   Routine general medical examination at a health care facility - Primary   Health maintenance reviewed and updated. Discussed nutrition, exercise. Follow-up 1 year.         IMMUNIZATIONS:   - Tdap: Tetanus vaccination status reviewed: last tetanus booster within 10 years. - Influenza: Postponed to flu season - Pneumovax: Not applicable - Prevnar: Not applicable - HPV: Not applicable - Shingrix vaccine: Not applicable  SCREENING: - Colonoscopy: Up to date  Discussed with patient purpose of the colonoscopy is to detect colon cancer at curable precancerous or early stages   - AAA Screening: Not applicable   PATIENT COUNSELING:    Sexuality: Discussed sexually transmitted diseases, partner selection, use of condoms, avoidance of unintended pregnancy  and contraceptive alternatives.   Advised to avoid cigarette smoking.  I discussed with the patient that most people  either abstain from alcohol or drink within safe limits (<=14/week and <=4 drinks/occasion for males, <=7/weeks and <= 3 drinks/occasion for females) and that the risk for alcohol disorders and other health effects rises proportionally with the number of drinks per week and how often a drinker exceeds daily limits.  Discussed cessation/primary prevention of drug use and availability of treatment for abuse.   Diet: Encouraged to adjust caloric intake to maintain  or achieve ideal body weight, to reduce intake of dietary saturated fat and total fat, to limit sodium intake by avoiding high sodium foods and not adding table salt, and to maintain adequate dietary potassium and calcium preferably from fresh fruits, vegetables, and low-fat dairy products.    stressed the importance of regular exercise  Injury prevention: Discussed safety belts, safety helmets, smoke detector, smoking near bedding or upholstery.   Dental health: Discussed importance of regular tooth brushing, flossing, and dental visits.   Follow up plan: NEXT PREVENTATIVE PHYSICAL DUE IN 1 YEAR. Return in about 1 year (around 02/01/2025) for CPE.  Audreanna Torrisi A Rhyen Mazariego

## 2024-02-02 NOTE — Assessment & Plan Note (Signed)
History of PFO found on TEE when he had hemiplegic migraine. He saw cardiology and no surgery was needed at the time. If any future symptoms or concerns, will refer back to cardiology.

## 2024-02-02 NOTE — Assessment & Plan Note (Signed)
 Chronic, stable. Check CMP, CBC, lipid panel today and treat based on results. Continue regular exercise and heart healthy eating.

## 2024-02-02 NOTE — Assessment & Plan Note (Signed)
Health maintenance reviewed and updated. Discussed nutrition, exercise. Follow-up 1 year.

## 2024-02-02 NOTE — Assessment & Plan Note (Signed)
 Chronic, stable. We will continue Lisinopril  10mg  daily. Check CMP, CBC today.

## 2024-04-19 ENCOUNTER — Encounter: Payer: Self-pay | Admitting: Emergency Medicine

## 2024-04-19 ENCOUNTER — Ambulatory Visit
Admission: EM | Admit: 2024-04-19 | Discharge: 2024-04-19 | Disposition: A | Discharge status: Home / Self Care | Attending: Physician Assistant | Admitting: Physician Assistant

## 2024-04-19 ENCOUNTER — Ambulatory Visit

## 2024-04-19 DIAGNOSIS — S80211A Abrasion, right knee, initial encounter: Secondary | ICD-10-CM | POA: Diagnosis not present

## 2024-04-19 DIAGNOSIS — S90511A Abrasion, right ankle, initial encounter: Secondary | ICD-10-CM

## 2024-04-19 DIAGNOSIS — L03115 Cellulitis of right lower limb: Secondary | ICD-10-CM

## 2024-04-19 MED ORDER — DOXYCYCLINE HYCLATE 100 MG PO CAPS: 100.0000 mg | ORAL_CAPSULE | Freq: Two times a day (BID) | ORAL | 0 refills | Status: AC
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
## 2024-04-22 ENCOUNTER — Encounter: Payer: Self-pay | Admitting: Physician Assistant

## 2024-04-23 ENCOUNTER — Telehealth: Payer: Self-pay | Admitting: *Deleted

## 2024-04-23 NOTE — Telephone Encounter (Signed)
 Message from pt access:  phone message, mrn [phone] Alan Lawson was seen 7/14 and is inquiring about a diff rx. [phone]  Chart reviewed by Luke Lesches, NP with recommendations:  Re: phone [phone] he needs to complete the antibiotic if no improvement return. He can purchase OTC neosporin to apply in addition to taking the doxycyline  I spoke to Alan Lawson and advised him of the above recommendations. He states wound is not worse, but still swollen. He also is on his feet all day for work. He states he has been using antibiotic cream (murpirocin) on wound also. He states he will continue to monitor wound and return if it worsens or has not improved when he completes the doxycycline .

## 2024-08-03 ENCOUNTER — Other Ambulatory Visit: Payer: Self-pay | Admitting: Nurse Practitioner

## 2024-08-03 NOTE — Telephone Encounter (Signed)
 Requesting: LISINOPRIL  10 MG TABLET  Last Visit: 02/02/2024 Next Visit: Visit date not found to follow up in 1 year from last ov Last Refill: 08/01/2023  Please Advise

## 2024-08-13 ENCOUNTER — Encounter: Payer: Self-pay | Admitting: Nurse Practitioner

## 2024-08-23 ENCOUNTER — Ambulatory Visit: Payer: Self-pay

## 2024-08-23 NOTE — Telephone Encounter (Signed)
 FYI Only or Action Required?: FYI only for provider: appointment scheduled on 08/24/2024 at 9:20 AM.  Patient was last seen in primary care on 02/02/2024 by Alan Tinnie LABOR, NP.  Called Nurse Triage reporting Abdominal Pain.  Symptoms began a year ago but increased over the last three months.  Interventions attempted: Rest, hydration, or home remedies.  Symptoms are: unchanged.  Triage Disposition: See Physician Within 24 Hours  Patient/caregiver understands and will follow disposition?: Yes  Copied from CRM #[id number]. Topic: Clinical - Red Word Triage >> Aug 23, 2024  3:10 PM Alfonso ORN wrote: Red Word that prompted transfer to Nurse Triage: possible hernia with bad stomach pain Reason for Disposition  [1] MODERATE pain (e.g., interferes with normal activities) AND [2] pain comes and goes (cramps) AND [3] present > 24 hours  (Exception: Pain with Vomiting or Diarrhea - see that Guideline.)  Answer Assessment - Initial Assessment Questions 1. LOCATION: Where does it hurt?      Lower left abdominal pain 2. RADIATION: Does the pain shoot anywhere else? (e.g., chest, back)     no 3. ONSET: When did the pain begin? (Minutes, hours or days ago)      Started a year ago but increased over the last three months 4. SUDDEN: Gradual or sudden onset?     gradual 5. PATTERN Does the pain come and go, or is it constant?     constant 6. SEVERITY: How bad is the pain?  (e.g., Scale 1-10; mild, moderate, or severe)     6 out of 10 7. RECURRENT SYMPTOM: Have you ever had this type of stomach pain before? If Yes, ask: When was the last time? and What happened that time?      no 8. CAUSE: What do you think is causing the stomach pain? (e.g., gallstones, recent abdominal surgery)     Concerned for possible hernia 9. RELIEVING/AGGRAVATING FACTORS: What makes it better or worse? (e.g., antacids, bending or twisting motion, bowel movement)     no 10. OTHER SYMPTOMS: Do you have  any other symptoms? (e.g., back pain, diarrhea, fever, urination pain, vomiting)       Lower back pain on the left side.  Protocols used: Abdominal Pain - Male-A-AH

## 2024-08-24 ENCOUNTER — Encounter: Payer: Self-pay | Admitting: Internal Medicine

## 2024-08-24 ENCOUNTER — Ambulatory Visit: Admitting: Internal Medicine

## 2024-08-24 VITALS — BP 140/72 | HR 69 | Temp 98.2°F | Ht 77.0 in | Wt 219.0 lb

## 2024-08-24 DIAGNOSIS — R1032 Left lower quadrant pain: Secondary | ICD-10-CM | POA: Diagnosis not present

## 2024-08-24 DIAGNOSIS — K429 Umbilical hernia without obstruction or gangrene: Secondary | ICD-10-CM | POA: Diagnosis not present

## 2024-08-24 LAB — POCT URINALYSIS DIPSTICK
Blood, UA: NEGATIVE
Glucose, UA: NEGATIVE
Ketones, UA: NEGATIVE
Leukocytes, UA: NEGATIVE
Nitrite, UA: NEGATIVE
Protein, UA: POSITIVE — AB
Spec Grav, UA: 1.025 (ref 1.010–1.025)
Urobilinogen, UA: 0.2 U/dL
pH, UA: 6 (ref 5.0–8.0)

## 2024-08-24 NOTE — Patient Instructions (Addendum)
 Ultrasound order placed at [facility]   [address], [place], [place] [address] Phone: [phone]  Imaging department will call to schedule appointment

## 2024-08-24 NOTE — Progress Notes (Signed)
 [facility] [address] [place] [address] Dept: [phone] Dept Fax: [phone]  Acute Care Office Visit  Subjective:   Alan Lawson March 15, 1976 08/24/2024  Chief Complaint  Patient presents with   Abdominal Pain    Dull left lower side, diarrhea, belly button is sticking out more than usual, week ago felt like he may have passed kidney stone    HPI:  Discussed the use of AI scribe software for clinical note transcription with the patient, who gave verbal consent to proceed.  History of Present Illness   Alan Lawson is a 48 year old male who presents with left-sided abdominal pain and back pain.  He has been experiencing left-sided abdominal pain and back pain for approximately one year, with a significant increase in severity over the past three months. The abdominal pain is localized to the left lower side and currently does not radiate to his back. In the last week and a half, he has noticed his umbilicus starting to protrude, which he suspects might be related to a hernia.  Approximately one week ago, he experienced a burning sensation while urinating, followed by a sudden relief of pain, which he speculates might have been a kidney stone passing. He reports no current burning sensation, no hematuria, and no associated back pain.   He has a history of irritable bowel syndrome (IBS), which he associates with occasional diarrhea. No recent nausea, vomiting, or fever.   The following portions of the patient's history were reviewed and updated as appropriate: past medical history, past surgical history, family history, social history, allergies, medications, and problem list.   Patient Active Problem List   Diagnosis Date Noted   Routine general medical examination at a health care facility 02/02/2024   Mixed hyperlipidemia 08/01/2023   Light headed 08/01/2023   Primary hypertension 04/10/2023   Palpitation  04/10/2023   PFO (patent foramen ovale) 09/28/2015   Past Medical History:  Diagnosis Date   GERD (gastroesophageal reflux disease)    Hypertension    IBS (irritable bowel syndrome)    PFO (patent foramen ovale)    documented by bubble study setting of a migraine headache   Psoriasis    Past Surgical History:  Procedure Laterality Date   SHOULDER SURGERY Left 2010   Family History  Problem Relation Age of Onset   Heart disease Father    Varicose Veins Father    ADD / ADHD Son     Current Outpatient Medications:    clobetasol  ointment (TEMOVATE ) 0.05 %, Apply 1 Application topically 2 (two) times daily as needed., Disp: 30 g, Rfl: 0   ibuprofen (ADVIL,MOTRIN) 200 MG tablet, Take 200 mg by mouth every 6 (six) hours as needed for moderate pain. , Disp: , Rfl:    lisinopril  (ZESTRIL ) 10 MG tablet, TAKE 1 TABLET BY MOUTH EVERY DAY, Disp: 90 tablet, Rfl: 2   Omeprazole Magnesium (PRILOSEC OTC PO), Take by mouth daily., Disp: , Rfl:  No Known Allergies   ROS: A complete ROS was performed with pertinent positives/negatives noted in the HPI. The remainder of the ROS are negative.    Objective:   Today's Vitals   08/24/24 0926  BP: (!) 140/72  Pulse: 69  Temp: 98.2 F (36.8 C)  TempSrc: Temporal  SpO2: 98%  Weight: 219 lb (99.3 kg)  Height: 6' 5 (1.956 m)    GENERAL: Well-appearing, in NAD. Well nourished.  SKIN: Pink, warm and dry. No rash, lesion,  ulceration, or ecchymoses.  NECK: Trachea midline. Full ROM w/o pain or tenderness. No lymphadenopathy.  RESPIRATORY: Chest wall symmetrical. Respirations even and non-labored. Breath sounds clear to auscultation bilaterally.  CARDIAC: S1, S2 present, regular rate and rhythm. Peripheral pulses 2+ bilaterally.  GI: Abdomen soft, mild ttp to LLQ/left inguinal and umbilical region. Small protrusion noted at belly button with chin-chest maneuver, reducible.  Normoactive bowel sounds. No hepatomegaly or splenomegaly. No CVA  tenderness.  EXTREMITIES: Without clubbing, cyanosis, or edema.  NEUROLOGIC:  Steady, even gait.  PSYCH/MENTAL STATUS: Alert, oriented x 3. Cooperative, appropriate mood and affect.    Results for orders placed or performed in visit on 08/24/24  POCT urinalysis dipstick  Result Value Ref Range   Color, UA     Clarity, UA     Glucose, UA Negative Negative   Bilirubin, UA 1+    Ketones, UA negative    Spec Grav, UA 1.025 1.010 - 1.025   Blood, UA negative    pH, UA 6.0 5.0 - 8.0   Protein, UA Positive (A) Negative   Urobilinogen, UA 0.2 0.2 or 1.0 E.U./dL   Nitrite, UA negative    Leukocytes, UA Negative Negative   Appearance     Odor        Assessment & Plan:  Assessment and Plan    Abdominal wall hernia, left lower quadrant Chronic left lower quadrant pain with umbilical protrusion suggests hernia. - Ordered abdominal ultrasound to evaluate for hernia. - Referred to general surgery for evaluation and potential surgical intervention. - Advised ER visit if pain worsens or persists, indicating possible incarceration.       No orders of the defined types were placed in this encounter.  Orders Placed This Encounter  Procedures   US  PELVIS (TRANSABDOMINAL ONLY)    Standing Status:   Future    Expiration Date:   08/24/2025    Reason for Exam (SYMPTOM  OR DIAGNOSIS REQUIRED):   evaluate for umbilical and left inguinal hernia    Preferred imaging location?:   [facility]   POCT urinalysis dipstick   Lab Orders         POCT urinalysis dipstick     No images are attached to the encounter or orders placed in the encounter.  Return for Scheduled Routine Office Visits and as needed.   Rosina Senters, FNP

## 2024-08-24 NOTE — Telephone Encounter (Signed)
 Noted. Patient has an appointment for 08/24/24.

## 2024-08-25 ENCOUNTER — Ambulatory Visit: Admitting: Nurse Practitioner

## 2024-08-25 ENCOUNTER — Ambulatory Visit (HOSPITAL_BASED_OUTPATIENT_CLINIC_OR_DEPARTMENT_OTHER)
Admission: RE | Admit: 2024-08-25 | Discharge: 2024-08-25 | Disposition: A | Discharge status: Home / Self Care | Source: Ambulatory Visit | Attending: Internal Medicine | Admitting: Internal Medicine

## 2024-08-25 DIAGNOSIS — R1032 Left lower quadrant pain: Secondary | ICD-10-CM | POA: Insufficient documentation

## 2024-08-25 DIAGNOSIS — K429 Umbilical hernia without obstruction or gangrene: Secondary | ICD-10-CM | POA: Diagnosis present

## 2024-09-21 ENCOUNTER — Ambulatory Visit: Payer: Self-pay | Admitting: Internal Medicine

## 2024-09-21 DIAGNOSIS — K429 Umbilical hernia without obstruction or gangrene: Secondary | ICD-10-CM | POA: Insufficient documentation

## 2025-02-07 ENCOUNTER — Encounter: Admitting: Nurse Practitioner

## 2025-02-08 ENCOUNTER — Encounter: Admitting: Nurse Practitioner
